# Patient Record
Sex: Female | Born: 1935 | Race: Black or African American | Hispanic: No | Smoking: Never smoker
Health system: Southern US, Community
[De-identification: ages and names within clinical notes are randomized; demographics above are authoritative.]

## PROBLEM LIST (undated history)

## (undated) DIAGNOSIS — Z5189 Encounter for other specified aftercare: Secondary | ICD-10-CM

## (undated) DIAGNOSIS — I214 Non-ST elevation (NSTEMI) myocardial infarction: Secondary | ICD-10-CM

## (undated) DIAGNOSIS — I509 Heart failure, unspecified: Secondary | ICD-10-CM

## (undated) DIAGNOSIS — I10 Essential (primary) hypertension: Secondary | ICD-10-CM

## (undated) DIAGNOSIS — I2510 Atherosclerotic heart disease of native coronary artery without angina pectoris: Secondary | ICD-10-CM

## (undated) DIAGNOSIS — K922 Gastrointestinal hemorrhage, unspecified: Secondary | ICD-10-CM

## (undated) DIAGNOSIS — E119 Type 2 diabetes mellitus without complications: Secondary | ICD-10-CM

## (undated) DIAGNOSIS — I469 Cardiac arrest, cause unspecified: Secondary | ICD-10-CM

## (undated) DIAGNOSIS — G931 Anoxic brain damage, not elsewhere classified: Secondary | ICD-10-CM

---

## 2016-09-12 ENCOUNTER — Inpatient Hospital Stay (HOSPITAL_COMMUNITY): Payer: Medicare Other

## 2016-09-12 ENCOUNTER — Encounter (HOSPITAL_COMMUNITY): Payer: Self-pay | Admitting: Emergency Medicine

## 2016-09-12 ENCOUNTER — Inpatient Hospital Stay (HOSPITAL_COMMUNITY)
Admission: EM | Admit: 2016-09-12 | Discharge: 2016-10-02 | DRG: 870 | Disposition: E | Payer: Medicare Other | Attending: Emergency Medicine | Admitting: Emergency Medicine

## 2016-09-12 ENCOUNTER — Emergency Department (HOSPITAL_COMMUNITY): Payer: Medicare Other

## 2016-09-12 DIAGNOSIS — Z515 Encounter for palliative care: Secondary | ICD-10-CM | POA: Diagnosis not present

## 2016-09-12 DIAGNOSIS — I5033 Acute on chronic diastolic (congestive) heart failure: Secondary | ICD-10-CM | POA: Diagnosis present

## 2016-09-12 DIAGNOSIS — J189 Pneumonia, unspecified organism: Secondary | ICD-10-CM | POA: Diagnosis present

## 2016-09-12 DIAGNOSIS — Y95 Nosocomial condition: Secondary | ICD-10-CM | POA: Diagnosis not present

## 2016-09-12 DIAGNOSIS — Z66 Do not resuscitate: Secondary | ICD-10-CM | POA: Diagnosis not present

## 2016-09-12 DIAGNOSIS — I2510 Atherosclerotic heart disease of native coronary artery without angina pectoris: Secondary | ICD-10-CM | POA: Diagnosis not present

## 2016-09-12 DIAGNOSIS — Z930 Tracheostomy status: Secondary | ICD-10-CM | POA: Diagnosis not present

## 2016-09-12 DIAGNOSIS — J9610 Chronic respiratory failure, unspecified whether with hypoxia or hypercapnia: Secondary | ICD-10-CM | POA: Diagnosis not present

## 2016-09-12 DIAGNOSIS — Z9911 Dependence on respirator [ventilator] status: Secondary | ICD-10-CM

## 2016-09-12 DIAGNOSIS — I959 Hypotension, unspecified: Secondary | ICD-10-CM | POA: Diagnosis not present

## 2016-09-12 DIAGNOSIS — A419 Sepsis, unspecified organism: Secondary | ICD-10-CM | POA: Diagnosis present

## 2016-09-12 DIAGNOSIS — R54 Age-related physical debility: Secondary | ICD-10-CM | POA: Diagnosis not present

## 2016-09-12 DIAGNOSIS — G931 Anoxic brain damage, not elsewhere classified: Secondary | ICD-10-CM | POA: Diagnosis present

## 2016-09-12 DIAGNOSIS — R403 Persistent vegetative state: Secondary | ICD-10-CM | POA: Diagnosis present

## 2016-09-12 DIAGNOSIS — N179 Acute kidney failure, unspecified: Secondary | ICD-10-CM | POA: Diagnosis not present

## 2016-09-12 DIAGNOSIS — E872 Acidosis: Secondary | ICD-10-CM | POA: Diagnosis not present

## 2016-09-12 DIAGNOSIS — R6521 Severe sepsis with septic shock: Secondary | ICD-10-CM | POA: Diagnosis not present

## 2016-09-12 DIAGNOSIS — L8993 Pressure ulcer of unspecified site, stage 3: Secondary | ICD-10-CM | POA: Diagnosis not present

## 2016-09-12 DIAGNOSIS — J9621 Acute and chronic respiratory failure with hypoxia: Secondary | ICD-10-CM | POA: Diagnosis present

## 2016-09-12 DIAGNOSIS — B370 Candidal stomatitis: Secondary | ICD-10-CM | POA: Diagnosis present

## 2016-09-12 DIAGNOSIS — Z888 Allergy status to other drugs, medicaments and biological substances status: Secondary | ICD-10-CM | POA: Diagnosis not present

## 2016-09-12 DIAGNOSIS — J9622 Acute and chronic respiratory failure with hypercapnia: Secondary | ICD-10-CM | POA: Diagnosis not present

## 2016-09-12 DIAGNOSIS — E11649 Type 2 diabetes mellitus with hypoglycemia without coma: Secondary | ICD-10-CM | POA: Diagnosis not present

## 2016-09-12 DIAGNOSIS — B379 Candidiasis, unspecified: Secondary | ICD-10-CM | POA: Diagnosis not present

## 2016-09-12 DIAGNOSIS — I468 Cardiac arrest due to other underlying condition: Secondary | ICD-10-CM | POA: Diagnosis present

## 2016-09-12 DIAGNOSIS — J9690 Respiratory failure, unspecified, unspecified whether with hypoxia or hypercapnia: Secondary | ICD-10-CM

## 2016-09-12 DIAGNOSIS — J9601 Acute respiratory failure with hypoxia: Secondary | ICD-10-CM | POA: Diagnosis not present

## 2016-09-12 DIAGNOSIS — I110 Hypertensive heart disease with heart failure: Secondary | ICD-10-CM | POA: Diagnosis present

## 2016-09-12 DIAGNOSIS — R680 Hypothermia, not associated with low environmental temperature: Secondary | ICD-10-CM | POA: Diagnosis present

## 2016-09-12 DIAGNOSIS — I252 Old myocardial infarction: Secondary | ICD-10-CM

## 2016-09-12 DIAGNOSIS — Z931 Gastrostomy status: Secondary | ICD-10-CM

## 2016-09-12 DIAGNOSIS — I469 Cardiac arrest, cause unspecified: Secondary | ICD-10-CM | POA: Diagnosis present

## 2016-09-12 DIAGNOSIS — D6489 Other specified anemias: Secondary | ICD-10-CM | POA: Diagnosis present

## 2016-09-12 DIAGNOSIS — L8990 Pressure ulcer of unspecified site, unspecified stage: Secondary | ICD-10-CM | POA: Insufficient documentation

## 2016-09-12 HISTORY — DX: Type 2 diabetes mellitus without complications: E11.9

## 2016-09-12 HISTORY — DX: Non-ST elevation (NSTEMI) myocardial infarction: I21.4

## 2016-09-12 HISTORY — DX: Essential (primary) hypertension: I10

## 2016-09-12 HISTORY — DX: Cardiac arrest, cause unspecified: I46.9

## 2016-09-12 HISTORY — DX: Encounter for other specified aftercare: Z51.89

## 2016-09-12 HISTORY — DX: Gastrointestinal hemorrhage, unspecified: K92.2

## 2016-09-12 HISTORY — DX: Atherosclerotic heart disease of native coronary artery without angina pectoris: I25.10

## 2016-09-12 HISTORY — DX: Heart failure, unspecified: I50.9

## 2016-09-12 HISTORY — DX: Anoxic brain damage, not elsewhere classified: G93.1

## 2016-09-12 LAB — COMPREHENSIVE METABOLIC PANEL
ALBUMIN: 2.2 g/dL — AB (ref 3.5–5.0)
ALK PHOS: 145 U/L — AB (ref 38–126)
ALT: 109 U/L — ABNORMAL HIGH (ref 14–54)
ALT: 101 U/L — AB (ref 14–54)
ANION GAP: 12 (ref 5–15)
ANION GAP: 11 (ref 5–15)
AST: 112 U/L — ABNORMAL HIGH (ref 15–41)
AST: 92 U/L — ABNORMAL HIGH (ref 15–41)
Albumin: 2.3 g/dL — ABNORMAL LOW (ref 3.5–5.0)
Alkaline Phosphatase: 142 U/L — ABNORMAL HIGH (ref 38–126)
BILIRUBIN TOTAL: 0.9 mg/dL (ref 0.3–1.2)
BUN: 32 mg/dL — ABNORMAL HIGH (ref 6–20)
BUN: 31 mg/dL — ABNORMAL HIGH (ref 6–20)
CALCIUM: 9.7 mg/dL (ref 8.9–10.3)
CHLORIDE: 96 mmol/L — AB (ref 101–111)
CO2: 29 mmol/L (ref 22–32)
CO2: 29 mmol/L (ref 22–32)
CREATININE: 0.96 mg/dL (ref 0.44–1.00)
Calcium: 9.7 mg/dL (ref 8.9–10.3)
Chloride: 95 mmol/L — ABNORMAL LOW (ref 101–111)
Creatinine, Ser: 1.02 mg/dL — ABNORMAL HIGH (ref 0.44–1.00)
GFR calc Af Amer: >60 mL/min (ref >60)
GFR calc non Af Amer: 55 mL/min — ABNORMAL LOW (ref >60)
GFR, EST AFRICAN AMERICAN: 59 mL/min — AB (ref >60)
GFR, EST NON AFRICAN AMERICAN: 51 mL/min — AB (ref >60)
GLUCOSE: 333 mg/dL — AB (ref 65–99)
Glucose, Bld: 203 mg/dL — ABNORMAL HIGH (ref 65–99)
Potassium: 4.9 mmol/L (ref 3.5–5.1)
Potassium: 4.5 mmol/L (ref 3.5–5.1)
SODIUM: 136 mmol/L (ref 135–145)
Sodium: 136 mmol/L (ref 135–145)
TOTAL PROTEIN: 7.5 g/dL (ref 6.5–8.1)
Total Bilirubin: 1.2 mg/dL (ref 0.3–1.2)
Total Protein: 7.3 g/dL (ref 6.5–8.1)

## 2016-09-12 LAB — URINALYSIS, ROUTINE W REFLEX MICROSCOPIC
Bacteria, UA: NONE SEEN
Bilirubin Urine: NEGATIVE
Bilirubin Urine: NEGATIVE
Glucose, UA: >=500 mg/dL — AB
Hgb urine dipstick: NEGATIVE
Ketones, ur: NEGATIVE mg/dL
Ketones, ur: 5 mg/dL — AB
Leukocytes, UA: NEGATIVE
Nitrite: NEGATIVE
Nitrite: NEGATIVE
PH: 8 (ref 5.0–8.0)
Protein, ur: NEGATIVE mg/dL
Protein, ur: NEGATIVE mg/dL
SPECIFIC GRAVITY, URINE: 1.01 (ref 1.005–1.030)
SPECIFIC GRAVITY, URINE: 1.01 (ref 1.005–1.030)
SQUAMOUS EPITHELIAL / LPF: NONE SEEN
pH: 7 (ref 5.0–8.0)

## 2016-09-12 LAB — I-STAT CG4 LACTIC ACID, ED: Lactic Acid, Venous: 2.63 mmol/L (ref 0.5–1.9)

## 2016-09-12 LAB — CBC
HCT: 31.7 % — ABNORMAL LOW (ref 36.0–46.0)
HEMOGLOBIN: 9.7 g/dL — AB (ref 12.0–15.0)
MCH: 25.6 pg — ABNORMAL LOW (ref 26.0–34.0)
MCHC: 30.6 g/dL (ref 30.0–36.0)
MCV: 83.6 fL (ref 78.0–100.0)
PLATELETS: 412 10*3/uL — AB (ref 150–400)
RBC: 3.79 MIL/uL — AB (ref 3.87–5.11)
RDW: 19.7 % — ABNORMAL HIGH (ref 11.5–15.5)
WBC: 23.5 10*3/uL — AB (ref 4.0–10.5)

## 2016-09-12 LAB — I-STAT ARTERIAL BLOOD GAS, ED
ACID-BASE EXCESS: 9 mmol/L — AB (ref 0.0–2.0)
Acid-Base Excess: 3 mmol/L — ABNORMAL HIGH (ref 0.0–2.0)
BICARBONATE: 28.3 mmol/L — AB (ref 20.0–28.0)
Bicarbonate: 34.6 mmol/L — ABNORMAL HIGH (ref 20.0–28.0)
O2 SAT: 100 %
O2 Saturation: 100 %
PCO2 ART: 44.5 mmHg (ref 32.0–48.0)
PH ART: 7.418 (ref 7.350–7.450)
PO2 ART: 168 mmHg — AB (ref 83.0–108.0)
Patient temperature: 98.6
Patient temperature: 98.5
TCO2: 36 mmol/L (ref 0–100)
TCO2: 30 mmol/L (ref 0–100)
pCO2 arterial: 53.6 mmHg — ABNORMAL HIGH (ref 32.0–48.0)
pH, Arterial: 7.412 (ref 7.350–7.450)
pO2, Arterial: 356 mmHg — ABNORMAL HIGH (ref 83.0–108.0)

## 2016-09-12 LAB — CBC WITH DIFFERENTIAL/PLATELET
BASOS ABS: 0 10*3/uL (ref 0.0–0.1)
Basophils Relative: 0 %
EOS ABS: 0 10*3/uL (ref 0.0–0.7)
Eosinophils Relative: 0 %
HCT: 34 % — ABNORMAL LOW (ref 36.0–46.0)
Hemoglobin: 10.5 g/dL — ABNORMAL LOW (ref 12.0–15.0)
LYMPHS PCT: 4 %
Lymphs Abs: 0.9 10*3/uL (ref 0.7–4.0)
MCH: 26 pg (ref 26.0–34.0)
MCHC: 30.9 g/dL (ref 30.0–36.0)
MCV: 84.2 fL (ref 78.0–100.0)
Monocytes Absolute: 0.5 10*3/uL (ref 0.1–1.0)
Monocytes Relative: 2 %
NEUTROS ABS: 21.4 10*3/uL — AB (ref 1.7–7.7)
Neutrophils Relative %: 94 %
PLATELETS: 391 10*3/uL (ref 150–400)
RBC: 4.04 MIL/uL (ref 3.87–5.11)
RDW: 19.8 % — ABNORMAL HIGH (ref 11.5–15.5)
WBC: 22.8 10*3/uL — AB (ref 4.0–10.5)

## 2016-09-12 LAB — PHOSPHORUS: PHOSPHORUS: 3.5 mg/dL (ref 2.5–4.6)

## 2016-09-12 LAB — GLUCOSE, CAPILLARY
GLUCOSE-CAPILLARY: 87 mg/dL (ref 65–99)
Glucose-Capillary: 305 mg/dL — ABNORMAL HIGH (ref 65–99)
Glucose-Capillary: 201 mg/dL — ABNORMAL HIGH (ref 65–99)
Glucose-Capillary: 80 mg/dL (ref 65–99)

## 2016-09-12 LAB — PROTIME-INR
INR: 1.18
INR: 1.17
PROTHROMBIN TIME: 15.1 s (ref 11.4–15.2)
PROTHROMBIN TIME: 15 s (ref 11.4–15.2)

## 2016-09-12 LAB — MRSA PCR SCREENING: MRSA by PCR: NEGATIVE

## 2016-09-12 LAB — APTT: APTT: 29 s (ref 24–36)

## 2016-09-12 LAB — PROCALCITONIN: PROCALCITONIN: 0.63 ng/mL

## 2016-09-12 LAB — I-STAT TROPONIN, ED: Troponin i, poc: 0.01 ng/mL (ref 0.00–0.08)

## 2016-09-12 LAB — CORTISOL: Cortisol, Plasma: 43.1 ug/dL

## 2016-09-12 LAB — MAGNESIUM
MAGNESIUM: 2.1 mg/dL (ref 1.7–2.4)
Magnesium: 1.8 mg/dL (ref 1.7–2.4)

## 2016-09-12 LAB — STREP PNEUMONIAE URINARY ANTIGEN: Strep Pneumo Urinary Antigen: NEGATIVE

## 2016-09-12 MED ORDER — FAMOTIDINE 40 MG/5ML PO SUSR
20.0000 mg | Freq: Two times a day (BID) | ORAL | Status: DC
  Administered 2016-09-12 – 2016-09-15 (×7): 20 mg
  Filled 2016-09-12 (×8): qty 2.5

## 2016-09-12 MED ORDER — ORAL CARE MOUTH RINSE
15.0000 mL | OROMUCOSAL | Status: DC
  Administered 2016-09-12 – 2016-09-16 (×42): 15 mL via OROMUCOSAL

## 2016-09-12 MED ORDER — SODIUM BICARBONATE 8.4 % IV SOLN
INTRAVENOUS | Status: AC | PRN
  Administered 2016-09-12: 100 meq via INTRAVENOUS

## 2016-09-12 MED ORDER — PANTOPRAZOLE SODIUM 40 MG IV SOLR: 40.0000 mg | Freq: Every day | INTRAVENOUS | Status: DC

## 2016-09-12 MED ORDER — VANCOMYCIN HCL IN DEXTROSE 750-5 MG/150ML-% IV SOLN
750.0000 mg | INTRAVENOUS | Status: DC
  Administered 2016-09-13 – 2016-09-15 (×3): 750 mg via INTRAVENOUS
  Filled 2016-09-12 (×3): qty 150

## 2016-09-12 MED ORDER — CALCIUM CHLORIDE 10 % IV SOLN
INTRAVENOUS | Status: AC | PRN
  Administered 2016-09-12: 1 g via INTRAVENOUS

## 2016-09-12 MED ORDER — INSULIN ASPART 100 UNIT/ML ~~LOC~~ SOLN
2.0000 [IU] | SUBCUTANEOUS | Status: DC
  Administered 2016-09-12 (×2): 6 [IU] via SUBCUTANEOUS
  Administered 2016-09-13 (×3): 4 [IU] via SUBCUTANEOUS
  Administered 2016-09-13: 6 [IU] via SUBCUTANEOUS
  Administered 2016-09-13: 2 [IU] via SUBCUTANEOUS
  Administered 2016-09-14: 6 [IU] via SUBCUTANEOUS
  Administered 2016-09-14: 2 [IU] via SUBCUTANEOUS

## 2016-09-12 MED ORDER — PIPERACILLIN-TAZOBACTAM 3.375 G IVPB: 3.3750 g | Freq: Three times a day (TID) | INTRAVENOUS | Status: DC

## 2016-09-12 MED ORDER — ACETAMINOPHEN 325 MG PO TABS: 650.0000 mg | ORAL_TABLET | ORAL | Status: DC | PRN

## 2016-09-12 MED ORDER — NOREPINEPHRINE BITARTRATE 1 MG/ML IV SOLN
5.0000 ug/min | INTRAVENOUS | Status: DC
  Administered 2016-09-12: 5 ug/min via INTRAVENOUS
  Filled 2016-09-12: qty 4

## 2016-09-12 MED ORDER — INSULIN ASPART 100 UNIT/ML ~~LOC~~ SOLN: 2.0000 [IU] | SUBCUTANEOUS | Status: DC

## 2016-09-12 MED ORDER — SODIUM CHLORIDE 0.9% FLUSH: 10.0000 mL | INTRAVENOUS | Status: DC | PRN

## 2016-09-12 MED ORDER — SODIUM CHLORIDE 0.9 % IV SOLN
INTRAVENOUS | Status: DC
  Administered 2016-09-12: 08:00:00 via INTRAVENOUS

## 2016-09-12 MED ORDER — VANCOMYCIN HCL IN DEXTROSE 1-5 GM/200ML-% IV SOLN
1000.0000 mg | Freq: Once | INTRAVENOUS | Status: AC
  Administered 2016-09-12: 1000 mg via INTRAVENOUS
  Filled 2016-09-12: qty 200

## 2016-09-12 MED ORDER — DEXTROSE 5 % IV SOLN
2.0000 g | Freq: Once | INTRAVENOUS | Status: AC
  Administered 2016-09-12: 2 g via INTRAVENOUS
  Filled 2016-09-12: qty 2

## 2016-09-12 MED ORDER — PIPERACILLIN-TAZOBACTAM 3.375 G IVPB
3.3750 g | Freq: Three times a day (TID) | INTRAVENOUS | Status: DC
  Administered 2016-09-12 – 2016-09-15 (×9): 3.375 g via INTRAVENOUS
  Filled 2016-09-12 (×11): qty 50

## 2016-09-12 MED ORDER — SODIUM CHLORIDE 0.9 % IV BOLUS (SEPSIS)
1000.0000 mL | Freq: Once | INTRAVENOUS | Status: AC
  Administered 2016-09-12: 1000 mL via INTRAVENOUS

## 2016-09-12 MED ORDER — CHLORHEXIDINE GLUCONATE 0.12% ORAL RINSE (MEDLINE KIT): 15.0000 mL | Freq: Two times a day (BID) | OROMUCOSAL | Status: DC

## 2016-09-12 MED ORDER — SODIUM CHLORIDE 0.9% FLUSH
10.0000 mL | Freq: Two times a day (BID) | INTRAVENOUS | Status: DC
  Administered 2016-09-12 – 2016-09-15 (×8): 10 mL

## 2016-09-12 MED ORDER — EPINEPHRINE PF 1 MG/10ML IJ SOSY
PREFILLED_SYRINGE | INTRAMUSCULAR | Status: AC | PRN
  Administered 2016-09-12: 1 mg via INTRAVENOUS

## 2016-09-12 MED FILL — Medication: Qty: 1 | Status: AC

## 2016-09-12 NOTE — ED Triage Notes (Signed)
Pt [facility] by [facility]. Pt was given a bath by [facility] staff and had a PEA arrest around 0340 4 mins of CPR given on [facility] one run of epic given. Initial HR Post CPR 150-170, per [facility] staff pt was no verbal but able to follow commands and follow with her eye, complete unresponsive after cpr. Pt has a trach in place, peg tube and foley catheter from [facility]. Dopamine 10 mcg/kg started on [facility].

## 2016-09-12 NOTE — H&P (Signed)
PULMONARY / CRITICAL CARE MEDICINE   Name: Ana Brown. Ana Brown MRN: [id number] DOB: April 24, 1935    ADMISSION DATE:  09/27/16   REFERRING MD:  EDP  CHIEF COMPLAINT: Post arrest  HISTORY OF PRESENT ILLNESS:   82 yo female who was treated at [facility]) for cardiac arrest 12/17 and required trach/vent/peg due to persistent vegetative state. 1/12 WHILE BEING BATHED SHE SUFFERED ANOTHER PEA ARREST and required epi , shock x2 along with CPR ?time. She was transported to [facility] ED and was started on levo for low BP. Not a candidate for hypothermia therefore will transfer to CCU forI CU care.  PAST MEDICAL HISTORY :  She  has a past medical history of Anoxic brain injury (HCC); Blood transfusion without reported diagnosis; Cardiac arrest [facility]); CHF (congestive heart failure) (HCC); Coronary artery disease; Diabetes mellitus without complication (HCC); GI bleed; Hypertension; and NSTEMI (non-ST elevated myocardial infarction) (HCC).  PAST SURGICAL HISTORY: She  has no past surgical history on file.  Allergies  Allergen Reactions  . Hibiclens [Chlorhexidine Gluconate] Other (See Comments)    On MAR    No current facility-administered medications on file prior to encounter.    No current outpatient prescriptions on file prior to encounter.    FAMILY HISTORY:  Her has no family status information on file.    SOCIAL HISTORY: She  reports that she has never smoked. She does not have any smokeless tobacco history on file. She reports that she does not drink alcohol or use drugs.  REVIEW OF SYSTEMS:   NA  SUBJECTIVE:  Transfer to ICU post arrest  VITAL SIGNS: BP (!) 147/71   Pulse 77   Temp 98.3 F (36.8 C) (Oral)   Resp 18   Ht 5' (1.524 m)   Wt 156 lb 15.5 oz (71.2 kg)   SpO2 100%   BMI 30.66 kg/m   HEMODYNAMICS:    VENTILATOR SETTINGS: Vent Mode: PRVC FiO2 (%):  [50 %-100 %] 50 % Set Rate:  [20 bmp] 20 bmp Vt Set:  [480 mL] 480 mL PEEP:  [5 cmH20-8 cmH20] 5  cmH20 Plateau Pressure:  [25 cmH20-28 cmH20] 28 cmH20  INTAKE / OUTPUT: I/O last 3 completed shifts: In: 47 [IV Piggyback:50] Out: -   PHYSICAL EXAMINATION: General: Frail wasted female Neuro:  +gag + startle  HEENT: #6 brovana trach Cardiovascular:  HSR RRR Lungs:  Decreased bs bases Abdomen:  Peg. + bs Musculoskeletal:  Rt foot post toes 4/5 removed Skin:  Warm and dry  LABS:  BMET  Recent Labs Lab September 27, 2016 0137 2016/09/27 0708  NA 136 136  K 4.9 4.5  CL 95* 96*  CO2 29 29  BUN 32* 31*  CREATININE 1.02* 0.96  GLUCOSE 203* 333*    Electrolytes  Recent Labs Lab 09/27/2016 0137 Sep 27, 2016 0708  CALCIUM 9.7 9.7  MG 2.1 1.8  PHOS  --  3.5    CBC  Recent Labs Lab 27-Sep-2016 0137 09/27/16 0708  WBC 22.8* 23.5*  HGB 10.5* 9.7*  HCT 34.0* 31.7*  PLT 391 412*    Coag's  Recent Labs Lab Sep 27, 2016 0137 2016/09/27 0708  APTT  --  29  INR 1.18 1.17    Sepsis Markers  Recent Labs Lab 09/27/2016 0539  LATICACIDVEN 2.63*    ABG  Recent Labs Lab September 27, 2016 0600 September 27, 2016 0736  PHART 7.418 7.412  PCO2ART 53.6* 44.5  PO2ART 356.0* 168.0*    Liver Enzymes  Recent Labs Lab 09/27/2016 0137 09/27/16 0708  AST 112*  92*  ALT 109* 101*  ALKPHOS 145* 142*  BILITOT 0.9 1.2  ALBUMIN 2.3* 2.2*    Cardiac Enzymes No results for input(s): TROPONINI, PROBNP in the last 168 hours.  Glucose No results for input(s): GLUCAP in the last 168 hours.  Imaging Ct Head Wo Contrast  Result Date: 09/22/2016 CLINICAL DATA:  Cardiac arrest today. Anoxic brain injury from cardiac arrest 07/2016. EXAM: CT HEAD WITHOUT CONTRAST TECHNIQUE: Contiguous axial images were obtained from the base of the skull through the vertex without intravenous contrast. COMPARISON:  None. FINDINGS: Brain: There is no evidence of acute cortical infarct, intracranial hemorrhage, mass, midline shift, or extra-axial fluid collection. Ventricular enlargement is favored to reflect central predominant  cerebral atrophy of a moderate degree. With patchy to confluent cerebral white matter hypodensities are nonspecific but compatible with moderately extensive chronic small vessel ischemic disease. Vascular: Calcified atherosclerosis at the skullbase. No hyperdense vessel. Skull: No fracture or focal osseous lesion. Sinuses/Orbits: Prior bilateral cataract extraction. Small volume fluid in the maxillary sinuses. Moderate bilateral ethmoid sinus mucosal thickening. Trace mastoid effusions. Other: None. IMPRESSION: 1. No evidence of acute intracranial abnormality. 2. Moderate chronic small vessel ischemic disease and cerebral atrophy Electronically Signed   By: Sebastian Ache M.D.   On: September 22, 2016 08:36   Dg Chest Port 1 View  Result Date: 09/22/2016 CLINICAL DATA:  Post cardiac arrest EXAM: PORTABLE CHEST 1 VIEW COMPARISON:  None. FINDINGS: Ventilatory device overlies the trachea at the level of the clavicles. There is mild cardiomegaly with aortic atherosclerosis. Bilateral mild parahilar opacities without focal consolidation or overt pulmonary edema. Left subclavian approach central venous catheter tip is at the junction of the brachiocephalic vein and superior vena cava. IMPRESSION: 1. Aortic atherosclerosis and mild cardiomegaly without overt pulmonary edema or focal consolidation. 2. Left subclavian approach central venous catheter tip at the junction of the brachiocephalic vein and superior vena cava. Electronically Signed   By: Deatra Robinson M.D.   On: Sep 22, 2016 05:54     STUDIES:  Sep 22, 2022 ct head neg  CULTURES: Sep 22, 2022 bcx2>> 22-Sep-2022 uc>> 09/22/2022 sputum>>  ANTIBIOTICS: Sep 22, 2022 vanc>> 09/22/2022 zoysn>>  SIGNIFICANT EVENTS: September 22, 2022 coded  LINES/TUBES: Left Harrold cvl>>  DISCUSSION: 82 yo female who was treated at [facility]) for cardiac arrest 12/17 and required trach/vent/peg due to persistent vegetative state. 09-22-2022 WHILE BEING BATHED SHE SUFFERED ANOTHER PEA ARREST and required epi , shock x2 along  with CPR ?time. She was transported to [facility] ED and was started on levo for low BP. Not a candidate for hypothermia therefore will transfer to CCU forI CU care. Lactic acid is elevated but she was coded .  ASSESSMENT / PLAN:  PULMONARY A: Chronic Trach/vent dependent since 12/17post arrest in [place] and [facility] to [facility] [place] Suspected resp arrest leading to cardiac event 09/22/22 Brovana armored trach in place P:   Vent bundle Cxr BD as needed   CARDIOVASCULAR A:  Post cardiac rest with epi/cpr x 2 rounds. Witnessed during bathing, may be resp arrest leading to cardiac event P:  Pressors as needed No Hypothermia Monitor cardiac status in ICU  RENAL Lab Results  Component Value Date   CREATININE 0.96 Sep 22, 2016   CREATININE 1.02 (H) Sep 22, 2016    A:   NO acute issue P:   Follow labs  GASTROINTESTINAL A:   Peg in place P:   Resume TF PPI  HEMATOLOGIC  Recent Labs  09/22/16 0137 22-Sep-2016 0708  HGB 10.5* 9.7*    A:  Chronic anemia P:  Follow H/H transfuse for hgb<7  INFECTIOUS A:   Pt at SNF P:   Treat with with Vanc/Zoysn and pan cuture  ENDOCRINE CBG (last 3)  No results for input(s): GLUCAP in the last 72 hours.   A:   DM  P:   SSI  NEUROLOGIC A:   Persistent vegetative state, trach/vent/peg dependent from previous neurological injury from cardiac arrest 12/17 [place] P:   RASS goal: 0 Opens eyes no tracks or follows Check head ct for completeness   FAMILY  - Updates: No family avaiable  - Inter-disciplinary family meet or Palliative Care meeting due by:  day 7  CCT 60 min  Brett CanalesSteve Minor ACNP [facility] PCCM Pager [phone] till 3 pm If no answer page [phone] 05-14-2017, 10:09 AM  Attending Note:  I have examined patient, reviewed labs, studies and notes. I have discussed the case with S Minor, and I agree with the data and plans as amended above. Unfortunate 82 yo woman, chronically debilitated,  vent and PEG dependent due to anoxic injury following cardiac arrest Dec '17. Per report is non-verbal but will follow some commands at her baseline. She suffered an apparent respiratory arrest and then cardiac arrest at [facility] on 1/12. She underwent CPR + epi + defib x 2, duration unclear. She was started on pressor and [facility]. On eval her BP is improved and pressors are being weaned to off. She will open her eyes to stim but does not follow any commands. She is breathing over the set vent rate. Lung exam has fine insp crackles, no wheeze. We will attempt to wean pressors to off, continue empiric broad abx pending resp cx data. CT head was checked prior to ICU transfer > nothing acute. Suspect that she is stabilizing although do not know what her new baseline will be. I will not initiate hypothermia given her prior baseline neuro status. Clearly discussions with family regarding goals for care would be beneficial here.  Independent critical care time is 40 minutes.   Levy Pupaobert Byrum, MD, PhD 05-14-2017, 3:44 PM [place] [facility] [phone] or if no answer [phone]

## 2016-09-12 NOTE — Code Documentation (Addendum)
Pulse check, shock given 150J.

## 2016-09-12 NOTE — ED Notes (Signed)
Dopamine paused by Dr. Mora Bellmanni.

## 2016-09-12 NOTE — Progress Notes (Signed)
Pharmacy Antibiotic Note  Ana Brown is a 82 y.o. female admitted on 09/05/2016 with pneumonia.  Pharmacy has been consulted for vancomycin and zosyn dosing.  Patient received cefepime 2g and vancomycin 1g IV once in the ED.  Plan: Vancomycin 1g IV every 24 hours.  Goal trough 15-20 mcg/mL. Zosyn 3.375g IV q8h (4 hour infusion).  Monitor culture data, renal function and clinical VT [facility]  Height: 5' (152.4 cm) Weight: 156 lb 15.5 oz (71.2 kg) IBW/kg (Calculated) : 45.5  Temp (24hrs), Avg:98.1 F (36.7 C), Min:98.1 F (36.7 C), Max:98.1 F (36.7 C)   Recent Labs Lab 09/23/2016 0137 09/15/2016 0539  WBC 22.8*  --   CREATININE 1.02*  --   LATICACIDVEN  --  2.63*    Estimated Creatinine Clearance: 39.4 mL/min (by C-G formula based on SCr of 1.02 mg/dL (H)).    Allergies  Allergen Reactions  . Hibiclens [Chlorhexidine Gluconate] Other (See Comments)    On MAR     Derwin Reddy M. Newman PiesBall, PharmD, BCPS Clinical Pharmacist Pager [phone] 09/21/2016 7:52 AM

## 2016-09-12 NOTE — ED Provider Notes (Signed)
[facility]-EMERGENCY DEPT Provider Note   CSN: [id number] Arrival date & time: 11/15/16  0515     History   Chief Complaint Chief Complaint  Patient presents with  . Cardiac Arrest    post CPR    HPI Ana Brown is a 82 y.o. female PMH of CAD, CHF, DM, HTN, here after cardiac arrest at her facility.  They were giving her a bath when she lost pulses (EMS states possible trach was kinked).  They did CPR with 2 rounds of epi and got ROSC.  She was placed on dopamine gtt and transferred here.  She is nonverbal and can not give any history.    HPI  Past Medical History:  Diagnosis Date  . Anoxic brain injury (HCC)   . Blood transfusion without reported diagnosis   . Cardiac arrest (HCC)   . CHF (congestive heart failure) (HCC)   . Coronary artery disease   . Diabetes mellitus without complication (HCC)   . GI bleed   . Hypertension   . NSTEMI (non-ST elevated myocardial infarction) (HCC)     There are no active problems to display for this patient.   History reviewed. No pertinent surgical history.  OB History    No data available       Home Medications    Prior to Admission medications   Not on File    Family History History reviewed. No pertinent family history.  Social History Social History  Substance Use Topics  . Smoking status: Never Smoker  . Smokeless tobacco: Not on file  . Alcohol use No     Allergies   Hibiclens [chlorhexidine gluconate]   Review of Systems Review of Systems  Unable to perform ROS: Patient nonverbal     Physical Exam Updated Vital Signs BP (!) 138/107 (BP Location: Right Arm)   Pulse (!) 159   Temp 98.1 F (36.7 C) (Oral)   Resp 20   SpO2 (!) 89%   Physical Exam  Constitutional: She appears well-developed and well-nourished. She appears distressed.  HENT:  Head: Atraumatic.  Nose: Nose normal.  Trach in place, no signs of infection or swelling.  Eyes: Conjunctivae are normal. No scleral icterus.  Neck: No  JVD present. No tracheal deviation present. No thyromegaly present.  Cardiovascular: Regular rhythm and normal heart sounds.  Exam reveals no gallop and no friction rub.   No murmur heard. tachycardic  Pulmonary/Chest: Effort normal and breath sounds normal. No respiratory distress. She has no wheezes. She exhibits no tenderness.  Abdominal: Soft. Bowel sounds are normal. She exhibits no distension and no mass. There is no tenderness. There is no rebound and no guarding.  Genitourinary:  Genitourinary Comments: Foley in place  Musculoskeletal: She exhibits edema.  Lymphadenopathy:    She has no cervical adenopathy.  Neurological:  Patient does not speak, she does move all extremities in response to painful stimuli.    Skin: Skin is warm and dry. Capillary refill takes less than 2 seconds. No rash noted. No erythema. No pallor.  Nursing note and vitals reviewed.    ED Treatments / Results  Labs (all labs ordered are listed, but only abnormal results are displayed) Labs Reviewed  CBC WITH DIFFERENTIAL/PLATELET - Abnormal; Notable for the following:       Result Value   WBC 22.8 (*)    Hemoglobin 10.5 (*)    HCT 34.0 (*)    RDW 19.8 (*)    All other components within normal limits  I-STAT  CG4 LACTIC ACID, ED - Abnormal; Notable for the following:    Lactic Acid, Venous 2.63 (*)    All other components within normal limits  I-STAT ARTERIAL BLOOD GAS, ED - Abnormal; Notable for the following:    pCO2 arterial 53.6 (*)    pO2, Arterial 356.0 (*)    Bicarbonate 34.6 (*)    Acid-Base Excess 9.0 (*)    All other components within normal limits  CULTURE, BLOOD (ROUTINE X 2)  CULTURE, BLOOD (ROUTINE X 2)  URINE CULTURE  COMPREHENSIVE METABOLIC PANEL  URINALYSIS, ROUTINE W REFLEX MICROSCOPIC  PROTIME-INR  MAGNESIUM  I-STAT TROPOININ, ED    EKG  EKG Interpretation  Date/Time:  Friday September 12 2016 05:40:40 EST Ventricular Rate:  122 PR Interval:    QRS Duration: 76 QT  Interval:  305 QTC Calculation: 435 R Axis:   17 Text Interpretation:  Sinus or ectopic atrial tachycardia frequent PVCs Probable anteroseptal infarct, old SVT has now resolved Confirmed by Erroll Luna [phone]) on 09/17/2016 6:18:05 AM       Radiology Dg Chest Port 1 View  Result Date: 09/24/2016 CLINICAL DATA:  Post cardiac arrest EXAM: PORTABLE CHEST 1 VIEW COMPARISON:  None. FINDINGS: Ventilatory device overlies the trachea at the level of the clavicles. There is mild cardiomegaly with aortic atherosclerosis. Bilateral mild parahilar opacities without focal consolidation or overt pulmonary edema. Left subclavian approach central venous catheter tip is at the junction of the brachiocephalic vein and superior vena cava. IMPRESSION: 1. Aortic atherosclerosis and mild cardiomegaly without overt pulmonary edema or focal consolidation. 2. Left subclavian approach central venous catheter tip at the junction of the brachiocephalic vein and superior vena cava. Electronically Signed   By: Deatra Robinson M.D.   On: 09/07/2016 05:54    Procedures Procedures (including critical care time)  Medications Ordered in ED Medications  vancomycin (VANCOCIN) IVPB 1000 mg/200 mL premix (not administered)  ceFEPIme (MAXIPIME) 2 g in dextrose 5 % 50 mL IVPB (not administered)  sodium chloride 0.9 % bolus 1,000 mL (1,000 mLs Intravenous New Bag/Given 09/01/2016 0551)  EPINEPHrine (ADRENALIN) 1 MG/10ML injection (1 mg Intravenous Given 09/09/2016 0533)  sodium bicarbonate injection (100 mEq Intravenous Given 09/30/2016 0541)  calcium chloride injection (1 g Intravenous Given 09/14/2016 0541)     Initial Impression / Assessment and Plan / ED Course  I have reviewed the triage vital signs and the nursing notes.  Pertinent labs & imaging results that were available during my care of the patient were reviewed by me and considered in my medical decision making (see chart for details).  Clinical Course    Patient  presents to the ED after cardiac arrest.  I stopped her dopamine as the patient appeared stable, but she went into cardiac arrest again.  We performed CPR with 2 rounds of epi and 2 shocks.  She received calcium and bicarb and we obtained ROSC.  She was placed on norepi gtt.  She was given broad spectrum abx and infectious work up is pending.     Lactate 2.6, LFTs are elevated, WBc is 22.  May be nonspecific in setting of code.  I spoke with CC and they will admit.  Advised for no cooling as the patient did not have any significant brain function prior to this.  Patient will be admitted for further care.   CRITICAL CARE Performed by: Tomasita Crumble   Total critical care time: 45 minutes  Critical care time was exclusive of separately billable procedures and treating  other patients.  Critical care was necessary to treat or prevent imminent or life-threatening deterioration.  Critical care was time spent personally by me on the following activities: development of treatment plan with patient and/or surrogate as well as nursing, discussions with consultants, evaluation of patient's response to treatment, examination of patient, obtaining history from patient or surrogate, ordering and performing treatments and interventions, ordering and review of laboratory studies, ordering and review of radiographic studies, pulse oximetry and re-evaluation of patient's condition.      Cardiopulmonary Resuscitation (CPR) Procedure Note Directed/Performed by: Tomasita Crumble I personally directed ancillary staff and/or performed CPR in an effort to regain return of spontaneous circulation and to maintain cardiac, neuro and systemic perfusion.      Final Clinical Impressions(s) / ED Diagnoses   Final diagnoses:  None    New Prescriptions New Prescriptions   No medications on file     Tomasita Crumble, MD 09/06/2016 [phone]

## 2016-09-12 NOTE — Progress Notes (Signed)
Paged Dr. Delton CoombesByrum, patient is a diabetic and has not been covered all day, cbg 305, orders to give 6 unit per slidiing scale and recheck at 1600. Will continue to monitor closely.

## 2016-09-12 NOTE — Code Documentation (Signed)
Continue CPR

## 2016-09-12 NOTE — Code Documentation (Signed)
Pulse check, pulse present 179.

## 2016-09-12 NOTE — Code Documentation (Signed)
Levo started at 6mcg

## 2016-09-12 NOTE — Code Documentation (Signed)
Shock given 200J 

## 2016-09-12 NOTE — Code Documentation (Signed)
VT rhythm after shock.

## 2016-09-12 NOTE — Progress Notes (Signed)
RT note:  Received pt from [facility].  Placed on PRVC 480/20/+5/.60 per [facility] settings.  Able to pass suction catheter down tracheostomy without difficulty.  ETCO2 45-50.  BBS present.

## 2016-09-12 NOTE — Code Documentation (Signed)
Pulse check.   

## 2016-09-12 NOTE — Progress Notes (Signed)
Initial Nutrition Assessment  DOCUMENTATION CODES:   Obesity unspecified  INTERVENTION:   Recommend:  Vital High Protein @ 25 ml/hr (600 ml/day) 30 ml Prostat TID Provides: 900 kcal, 97 grams protein, and 501 ml H2O.   NUTRITION DIAGNOSIS:   Inadequate oral intake related to inability to eat as evidenced by NPO status.  GOAL:   Provide needs based on ASPEN/SCCM guidelines  MONITOR:   I & O's, Skin, Vent status  REASON FOR ASSESSMENT:   Consult Assessment of nutrition requirement/status (TF recommendations)  ASSESSMENT:   Pt with PMH of CAD, CHF, DM, HTN, recent cardiac arrest in [place] which resulted in vegetative state with trach/PEG now admitted after cardiac arrest at her facility  Pt on vent support via trach. Not a candidate for hypothermia.  Nutrition-Focused physical exam completed. Findings are no fat depletion, no muscle depletion, and no edema.   Diet Order:  Diet NPO time specified  Skin:  Wound (see comment) (stage III sacrum)  Last BM:  unknown  Height:   Ht Readings from Last 1 Encounters:  09/30/2016 5' (1.524 m)    Weight:   Wt Readings from Last 1 Encounters:  09/28/2016 156 lb 15.5 oz (71.2 kg)    Ideal Body Weight:  45.4 kg  BMI:  Body mass index is 30.66 kg/m.  Estimated Nutritional Needs:   Kcal:  783-996  Protein:  >/= 90 grams  Fluid:  > 1.5 L/day  EDUCATION NEEDS:   No education needs identified at this time  Kendell BaneHeather Cason Luffman RD, LDN, CNSC [phone] Pager [phone] After Hours Pager

## 2016-09-13 ENCOUNTER — Inpatient Hospital Stay (HOSPITAL_COMMUNITY): Payer: Medicare Other

## 2016-09-13 DIAGNOSIS — J189 Pneumonia, unspecified organism: Secondary | ICD-10-CM

## 2016-09-13 DIAGNOSIS — R403 Persistent vegetative state: Secondary | ICD-10-CM

## 2016-09-13 DIAGNOSIS — L8990 Pressure ulcer of unspecified site, unspecified stage: Secondary | ICD-10-CM | POA: Insufficient documentation

## 2016-09-13 DIAGNOSIS — J9621 Acute and chronic respiratory failure with hypoxia: Secondary | ICD-10-CM

## 2016-09-13 DIAGNOSIS — J9622 Acute and chronic respiratory failure with hypercapnia: Secondary | ICD-10-CM

## 2016-09-13 DIAGNOSIS — A419 Sepsis, unspecified organism: Principal | ICD-10-CM

## 2016-09-13 LAB — BASIC METABOLIC PANEL
ANION GAP: 9 (ref 5–15)
BUN: 27 mg/dL — ABNORMAL HIGH (ref 6–20)
CO2: 28 mmol/L (ref 22–32)
Calcium: 9.1 mg/dL (ref 8.9–10.3)
Chloride: 102 mmol/L (ref 101–111)
Creatinine, Ser: 0.82 mg/dL (ref 0.44–1.00)
GLUCOSE: 85 mg/dL (ref 65–99)
POTASSIUM: 3.7 mmol/L (ref 3.5–5.1)
Sodium: 139 mmol/L (ref 135–145)

## 2016-09-13 LAB — URINE CULTURE

## 2016-09-13 LAB — CBC
HCT: 26.7 % — ABNORMAL LOW (ref 36.0–46.0)
Hemoglobin: 8.4 g/dL — ABNORMAL LOW (ref 12.0–15.0)
MCH: 25.8 pg — ABNORMAL LOW (ref 26.0–34.0)
MCHC: 31.5 g/dL (ref 30.0–36.0)
MCV: 81.9 fL (ref 78.0–100.0)
PLATELETS: 350 10*3/uL (ref 150–400)
RBC: 3.26 MIL/uL — AB (ref 3.87–5.11)
RDW: 19.4 % — ABNORMAL HIGH (ref 11.5–15.5)
WBC: 12.9 10*3/uL — AB (ref 4.0–10.5)

## 2016-09-13 LAB — PHOSPHORUS
PHOSPHORUS: 3.4 mg/dL (ref 2.5–4.6)
Phosphorus: 4.7 mg/dL — ABNORMAL HIGH (ref 2.5–4.6)

## 2016-09-13 LAB — GLUCOSE, CAPILLARY
GLUCOSE-CAPILLARY: 259 mg/dL — AB (ref 65–99)
GLUCOSE-CAPILLARY: 187 mg/dL — AB (ref 65–99)
GLUCOSE-CAPILLARY: 171 mg/dL — AB (ref 65–99)
GLUCOSE-CAPILLARY: 137 mg/dL — AB (ref 65–99)
Glucose-Capillary: 130 mg/dL — ABNORMAL HIGH (ref 65–99)
Glucose-Capillary: 146 mg/dL — ABNORMAL HIGH (ref 65–99)
Glucose-Capillary: 168 mg/dL — ABNORMAL HIGH (ref 65–99)
Glucose-Capillary: 279 mg/dL — ABNORMAL HIGH (ref 65–99)
Glucose-Capillary: 47 mg/dL — ABNORMAL LOW (ref 65–99)
Glucose-Capillary: 48 mg/dL — ABNORMAL LOW (ref 65–99)

## 2016-09-13 LAB — MAGNESIUM
MAGNESIUM: 1.9 mg/dL (ref 1.7–2.4)
Magnesium: 1.8 mg/dL (ref 1.7–2.4)

## 2016-09-13 LAB — LACTIC ACID, PLASMA: Lactic Acid, Venous: 0.6 mmol/L (ref 0.5–1.9)

## 2016-09-13 MED ORDER — VITAL HIGH PROTEIN PO LIQD
1000.0000 mL | ORAL | Status: DC
  Administered 2016-09-13 – 2016-09-15 (×3): 1000 mL
  Filled 2016-09-13 (×4): qty 1000

## 2016-09-13 MED ORDER — ACETAMINOPHEN 325 MG PO TABS: 650.0000 mg | ORAL_TABLET | ORAL | Status: DC | PRN

## 2016-09-13 MED ORDER — SODIUM CHLORIDE 0.9 % IV BOLUS (SEPSIS)
1000.0000 mL | Freq: Once | INTRAVENOUS | Status: AC
  Administered 2016-09-13: 1000 mL via INTRAVENOUS

## 2016-09-13 MED ORDER — PRO-STAT SUGAR FREE PO LIQD
30.0000 mL | Freq: Two times a day (BID) | ORAL | Status: DC
  Administered 2016-09-13: 30 mL
  Filled 2016-09-13: qty 30

## 2016-09-13 MED ORDER — PRO-STAT SUGAR FREE PO LIQD
30.0000 mL | Freq: Three times a day (TID) | ORAL | Status: DC
  Administered 2016-09-13 – 2016-09-16 (×9): 30 mL
  Filled 2016-09-13 (×9): qty 30

## 2016-09-13 MED ORDER — FENTANYL CITRATE (PF) 100 MCG/2ML IJ SOLN
50.0000 ug | INTRAMUSCULAR | Status: DC | PRN
  Administered 2016-09-13 (×2): 50 ug via INTRAVENOUS
  Filled 2016-09-13 (×4): qty 2

## 2016-09-13 MED ORDER — MIDAZOLAM HCL 2 MG/2ML IJ SOLN: 1.0000 mg | INTRAMUSCULAR | Status: DC | PRN

## 2016-09-13 MED ORDER — ATORVASTATIN CALCIUM 80 MG PO TABS
80.0000 mg | ORAL_TABLET | Freq: Every day | ORAL | Status: DC
  Administered 2016-09-13 – 2016-09-16 (×4): 80 mg
  Filled 2016-09-13 (×4): qty 1

## 2016-09-13 MED ORDER — DEXTROSE-NACL 5-0.45 % IV SOLN
INTRAVENOUS | Status: DC
  Administered 2016-09-13: 04:00:00 via INTRAVENOUS

## 2016-09-13 MED ORDER — DEXTROSE 50 % IV SOLN
25.0000 mL | Freq: Once | INTRAVENOUS | Status: AC
  Administered 2016-09-13: 25 mL via INTRAVENOUS
  Filled 2016-09-13: qty 50

## 2016-09-13 MED ORDER — DEXTROSE 50 % IV SOLN
25.0000 mL | Freq: Once | INTRAVENOUS | Status: AC
  Administered 2016-09-13: 25 mL via INTRAVENOUS

## 2016-09-13 MED ORDER — VITAL HIGH PROTEIN PO LIQD
1000.0000 mL | ORAL | Status: DC
  Administered 2016-09-13: 1000 mL
  Filled 2016-09-13 (×2): qty 1000

## 2016-09-13 NOTE — Progress Notes (Signed)
I meet with pt's two daughters.  Pt had initial event in November 2017.  She has not had any significant improvement in neuro status.  I had detailed discussion about concerns for long term prognosis.  They are agreeable to having further discussion with palliative care.  Coralyn HellingVineet Kamaile Zachow, MD [facility] 09/13/2016, 9:43 AM Pager:  [phone] After 3pm call: [phone]

## 2016-09-13 NOTE — Progress Notes (Signed)
PCCM Progress Note  Subjective:  Had trouble with Peak pressures and not matching Vt.  Vitals: BP 99/66   Pulse 64   Temp 97.4 F (36.3 C) (Oral)   Resp (!) 27   Ht 5' (1.524 m)   Wt 166 lb 3.6 oz (75.4 kg)   SpO2 100%   BMI 32.46 kg/m   Intake/outpt: I/O last 3 completed shifts: In: 3849.5 [I.V.:1499.5; IV Piggyback:2350] Out: 2130 [Urine:2130]  General: on vent Neuro: doesn't follow commands, grimaces with painful stimulation Eyes: pupils reactive ENT: trach site with clear secretions Cardiac: regular, no murmur Chest: decreased BS on Lt Abd: soft, non tender Ext: 1+ edema Skin: pressure ulcers present prior to this admission   CMP Latest Ref Rng & Units 09/13/2016 December 09, 2016 December 09, 2016  Glucose 65 - 99 mg/dL 85 161(W333(H) 960(A203(H)  BUN 6 - 20 mg/dL 54(U27(H) 98(J31(H) 19(J32(H)  Creatinine 0.44 - 1.00 mg/dL 4.780.82 2.950.96 6.21(H1.02(H)  Sodium 135 - 145 mmol/L 139 136 136  Potassium 3.5 - 5.1 mmol/L 3.7 4.5 4.9  Chloride 101 - 111 mmol/L 102 96(L) 95(L)  CO2 22 - 32 mmol/L 28 29 29   Calcium 8.9 - 10.3 mg/dL 9.1 9.7 9.7  Total Protein 6.5 - 8.1 g/dL - 7.3 7.5  Total Bilirubin 0.3 - 1.2 mg/dL - 1.2 0.9  Alkaline Phos 38 - 126 U/L - 142(H) 145(H)  AST 15 - 41 U/L - 92(H) 112(H)  ALT 14 - 54 U/L - 101(H) 109(H)    CBC Latest Ref Rng & Units 09/13/2016 December 09, 2016 December 09, 2016  WBC 4.0 - 10.5 K/uL 12.9(H) 23.5(H) 22.8(H)  Hemoglobin 12.0 - 15.0 g/dL 0.8(M8.4(L) 5.7(Q9.7(L) 10.5(L)  Hematocrit 36.0 - 46.0 % 26.7(L) 31.7(L) 34.0(L)  Platelets 150 - 400 K/uL 350 412(H) 391    ABG    Component Value Date/Time   PHART 7.358 09/13/2016 0420   PCO2ART 52.1 (H) 09/13/2016 0420   PO2ART 149 (H) 09/13/2016 0420   HCO3 28.6 (H) 09/13/2016 0420   TCO2 30 0April 10, 2018 0736   O2SAT 98.9 09/13/2016 0420    CBG (last 3)   Recent Labs  09/13/16 0050 09/13/16 0510 09/13/16 0517  GLUCAP 130* 279* 146*    Imaging: Ct Head Wo Contrast  Result Date: December 09, 2016 CLINICAL DATA:  Cardiac arrest today. Anoxic brain  injury from cardiac arrest 07/2016. EXAM: CT HEAD WITHOUT CONTRAST TECHNIQUE: Contiguous axial images were obtained from the base of the skull through the vertex without intravenous contrast. COMPARISON:  None. FINDINGS: Brain: There is no evidence of acute cortical infarct, intracranial hemorrhage, mass, midline shift, or extra-axial fluid collection. Ventricular enlargement is favored to reflect central predominant cerebral atrophy of a moderate degree. With patchy to confluent cerebral white matter hypodensities are nonspecific but compatible with moderately extensive chronic small vessel ischemic disease. Vascular: Calcified atherosclerosis at the skullbase. No hyperdense vessel. Skull: No fracture or focal osseous lesion. Sinuses/Orbits: Prior bilateral cataract extraction. Small volume fluid in the maxillary sinuses. Moderate bilateral ethmoid sinus mucosal thickening. Trace mastoid effusions. Other: None. IMPRESSION: 1. No evidence of acute intracranial abnormality. 2. Moderate chronic small vessel ischemic disease and cerebral atrophy Electronically Signed   By: Sebastian AcheAllen  Grady M.D.   On: 0April 10, 2018 08:36   Dg Chest Port 1 View  Result Date: December 09, 2016 CLINICAL DATA:  Post cardiac arrest EXAM: PORTABLE CHEST 1 VIEW COMPARISON:  None. FINDINGS: Ventilatory device overlies the trachea at the level of the clavicles. There is mild cardiomegaly with aortic atherosclerosis. Bilateral mild parahilar opacities without focal consolidation or overt  pulmonary edema. Left subclavian approach central venous catheter tip is at the junction of the brachiocephalic vein and superior vena cava. IMPRESSION: 1. Aortic atherosclerosis and mild cardiomegaly without overt pulmonary edema or focal consolidation. 2. Left subclavian approach central venous catheter tip at the junction of the brachiocephalic vein and superior vena cava. Electronically Signed   By: Deatra Robinson M.D.   On: 09/28/2016 05:54    Studies: CT head 1/12  >> moderate chronic small vessel ischemic disease and cerebral atrophy  Lines/tubes: Trach >> Lt [place] CVL 1/12 >>   Cultures: Blood 1/12 >> Sputum 1/12 >> Urine 1/12 >>  Antibiotics: Vancomycin 1/12 >> Zosyn 1/12 >>  Events: 1/12 Transfer from [facility] 1/13 Off pressors  Summary: 82 yo female s/p cardiac arrest 08/17/16 with anoxic encephalopathy and persistent vegetative state.  She was at [facility] in [place], [place] and then transferred to [facility] in [place], [place].  She developed PEA arrest 1/12 and transferred to [facility].  PMHx of CAD, DM, GI bleed, HTN  Assessment/plan:  Recurrent cardiac arrest. - recommend conservative management strategy  Anoxic encephalopathy with persistent vegetative state. - prn versed, fentanyl  Acute on chronic hypoxic respiratory failure. Failure to wean s/p tracheostomy with Brovona armored trach. - f/u CXR - full vent support  Sepsis with lactic acidosis and possible HCAP. - continue Abx - f/u lactic acid, cultures - goal SBP > 85  Pressure ulcers. - present prior to this admission - wound care  DM type II. - SSI  DVT prophylaxis - SCDs SUP - pepcid Nutrition - tube feeds Goals of care - full code.  Consult palliative care  CC time 32 minutes  Coralyn Helling, MD [facility] Pulmonary/Critical Care 09/13/2016, 7:58 AM Pager:  [phone] After 3pm call: [phone]

## 2016-09-13 NOTE — Progress Notes (Signed)
Patient blood sugar re-checked and is within normal range. Insulin held for 0000.   Carrie Usery E Mylinda LatinaNino Combs, [place]RN

## 2016-09-13 NOTE — Progress Notes (Signed)
eLink Physician-Brief Progress Note Patient Name: Ana Brown DOB: [birth year] MRN: [id number]   Date of Service  09/13/2016  HPI/Events of Note  Downward trending blood pressure. Previously taken off vasopressor support and central venous access. Currently on FiO2 0.4.   eICU Interventions  1. Bolus normal saline 1 L 2. May need to restart vasopressor depending upon blood pressure response      Intervention Category Intermediate Interventions: Hypotension - evaluation and management  Lawanda CousinsJennings Genifer Lazenby 09/13/2016, 1:37 AM

## 2016-09-13 NOTE — Progress Notes (Signed)
Nutrition Follow-up  DOCUMENTATION CODES:  Obesity unspecified  INTERVENTION:  PEPup protocol. Vital High Protein @ 25 ml/hr (600 ml/day) 30 ml Prostat TID Provides: 900 kcal, 97 grams protein, and 501 ml H2O.   NUTRITION DIAGNOSIS:  Inadequate oral intake related to inability to eat as evidenced by NPO status.  GOAL:  Provide needs based on ASPEN/SCCM guidelines  MONITOR:  I & O's, Skin, Vent status  REASON FOR ASSESSMENT:  Consult Assessment of nutrition requirement/status (TF recommendations)  ASSESSMENT:  Pt with PMH of CAD, CHF, DM, HTN, recent cardiac arrest in [place] which resulted in vegetative state with trach/PEG now admitted after cardiac arrest at her facility  Pt has been vent dependent x 1 month. Persistent vegetative state.   Her admit weight reflects a weight loss of 13 lbs (7.5% bw) [facility] 1 month [facility]. This is a clinically significant loss. Given recent transition to a vegetative state, likely lost wt from muscle atrophy  Abdomen: Soft, non distended  Patient is currently intubated on ventilator support MV: 5.4 L/min Temp (24hrs), Avg:97.6 F (36.4 C), Min:97.2 F (36.2 C), Max:98.1 F (36.7 C)  Propofol: None  Medications: Pepcid, IV ABX,  Labs: Intermittent Hyperglycemia, WBC: 12.9   Recent Labs Lab 09/01/2016 0137 09/08/2016 0708 09/13/16 0345 09/13/16 0800  NA 136 136 139  --   K 4.9 4.5 3.7  --   CL 95* 96* 102  --   CO2 29 29 28   --   BUN 32* 31* 27*  --   CREATININE 1.02* 0.96 0.82  --   CALCIUM 9.7 9.7 9.1  --   MG 2.1 1.8  --  1.8  PHOS  --  3.5  --  3.4  GLUCOSE 203* 333* 85  --    Diet Order:  Diet NPO time specified  Skin:  Laceration to shoulder MSAD to buttocks/groin, stage 1 to trach site stage III sacrum/  Last BM:  unknown  Height:  Ht Readings from Last 1 Encounters:  09/30/2016 5' (1.524 m)   Weight:  Wt Readings from Last 1 Encounters:  09/13/16 166 lb 3.6 oz (75.4 kg)    Wt Readings from Last 10 Encounters:  09/13/16 166 lb 3.6 oz (75.4 kg)  Admit weight: 157 lbs (71.36 kg)  Per Care Everywhere: Pt weighed 170.6 lbs (77.4 kg) on 12/11 at time of her previous admission.   Ideal Body Weight:  45.4 kg  BMI:  Body mass index is 32.46 kg/m.  Estimated Nutritional Needs:  Kcal:  783-996 Protein:  >/= 90 grams Fluid:  > 1.5 L/day  EDUCATION NEEDS:  No education needs identified at this time  Christophe LouisNathan Qasim Diveley RD, LDN, CNSC Clinical Nutrition Pager: [phone] 09/13/2016 10:40 AM

## 2016-09-13 NOTE — Progress Notes (Signed)
eLink Physician-Brief Progress Note Patient Name: Ana Brown DOB: 10/07/1934 MRN: [id number]   Date of Service  09/13/2016  HPI/Events of Note  Hypoglycemia requiring repeat dextrose bolus.   eICU Interventions  Switching to D5 with half normal saline at 75 mL per hour      Intervention Category Intermediate Interventions: Other:  Lawanda CousinsJennings Mujahid Jalomo 09/13/2016, 4:09 AM

## 2016-09-13 NOTE — Progress Notes (Signed)
Pt blood sugar 48 when checked at 0000. 25 ml D 50 given. Will re-check blood sugar and continue to monitor patient.   Zakeya Junker E Mylinda LatinaNino Combs, [place]RN

## 2016-09-13 NOTE — Progress Notes (Signed)
Hypoglycemic Event  CBG: 60  Treatment: D50 IV 25 mL  Symptoms: None  Follow-up CBG: Time:0430 CBG Result:146 (finger stick)  Possible Reasons for Event: Unknown  Comments/MD notified:E-Link MD notified; pt was started on D5 with half NS to treat her hypoglycemia.   Let E-Link MD know about latest blood sugar; waiting for further orders.   Will continue to monitor pt.  Ana Brown E Nino Combs

## 2016-09-13 NOTE — Progress Notes (Signed)
Blood pressures low with MAPs <65. Notified Ana-Link MD. Received orders to give 1 L bolus.   Checked CVP = 6 prior to giving fluid bolus.   Patient currently receiving 1 L bolus. Will continue to monitor blood pressure and will notify MD if any changes occur.   Ana Brown Ana Brown, [place]RN

## 2016-09-13 NOTE — Progress Notes (Signed)
No charge note.   Palliative consult request received.  Chart reviewed: 82 year old lady with PEA arrest, history of cardiac arrest in December 2017, s/p trach/peg placement. A palliative consult has been placed for goals of care discussions with family. Patient has 4 children.  Patient seen briefly, discussed with bedside RN. Call placed and discussed with daughter Ana Brown [phone]. I have given her our palliative number [phone], await call back regarding time/date of family meeting.  Full note and further recommendations to follow.   Ana HawkingZeba Marcin Holte MD [facility] palliative medicine team [phone]

## 2016-09-13 NOTE — Progress Notes (Signed)
Upon arrival to patient room, patient ventilator was alarming high peak pressure.  Patient was noted to have a leak in airway.  Was unable to pass suction catheter.  Sats were maintaining at 100%.  Deflated cuff and advanced trach a slight amount and was then able to pass suction catheter and numbers on ventilator slightly improved.  Decreased trigger to -2.  Patient is still noted to have small leak, although cuff is adequately inflated.  MD is aware and will order chest xray to see trach placement.  Will continue to monitor.

## 2016-09-13 NOTE — Progress Notes (Signed)
eLink Physician-Brief Progress Note Patient Name: Ana Brown DOB: 12/27/1934 MRN: [id number]   Date of Service  09/13/2016  HPI/Events of Note  Hypothermia - Temp = 94.5 F.   eICU Interventions  Will order [employer]ger.      Intervention Category Intermediate Interventions: Other:  Lenell AntuSommer,Steven Eugene 09/13/2016, 9:47 PM

## 2016-09-14 ENCOUNTER — Encounter (HOSPITAL_COMMUNITY): Payer: Self-pay | Admitting: *Deleted

## 2016-09-14 DIAGNOSIS — J9601 Acute respiratory failure with hypoxia: Secondary | ICD-10-CM

## 2016-09-14 DIAGNOSIS — J9690 Respiratory failure, unspecified, unspecified whether with hypoxia or hypercapnia: Secondary | ICD-10-CM

## 2016-09-14 DIAGNOSIS — R403 Persistent vegetative state: Secondary | ICD-10-CM

## 2016-09-14 LAB — BASIC METABOLIC PANEL
Anion gap: 8 (ref 5–15)
BUN: 35 mg/dL — ABNORMAL HIGH (ref 6–20)
CHLORIDE: 104 mmol/L (ref 101–111)
CO2: 29 mmol/L (ref 22–32)
CREATININE: 1.09 mg/dL — AB (ref 0.44–1.00)
Calcium: 9 mg/dL (ref 8.9–10.3)
GFR calc non Af Amer: 47 mL/min — ABNORMAL LOW (ref >60)
GFR, EST AFRICAN AMERICAN: 54 mL/min — AB (ref >60)
Glucose, Bld: 138 mg/dL — ABNORMAL HIGH (ref 65–99)
POTASSIUM: 4.1 mmol/L (ref 3.5–5.1)
Sodium: 141 mmol/L (ref 135–145)

## 2016-09-14 LAB — BLOOD GAS, ARTERIAL
ACID-BASE DEFICIT: 0.8 mmol/L (ref 0.0–2.0)
Acid-base deficit: 1.1 mmol/L (ref 0.0–2.0)
Acid-base deficit: 1.3 mmol/L (ref 0.0–2.0)
BICARBONATE: 29.3 mmol/L — AB (ref 20.0–28.0)
BICARBONATE: 26.6 mmol/L (ref 20.0–28.0)
Bicarbonate: 28.8 mmol/L — ABNORMAL HIGH (ref 20.0–28.0)
DRAWN BY: 252031
Drawn by: 257591
Drawn by: 252031
FIO2: 40
FIO2: 40
FIO2: 0.4
LHR: 22 {breaths}/min
LHR: 26 {breaths}/min
O2 SAT: 96.7 %
O2 SAT: 95.8 %
O2 Saturation: 94.8 %
PATIENT TEMPERATURE: 98.6
PATIENT TEMPERATURE: 98.6
PATIENT TEMPERATURE: 98.6
PCO2 ART: 116 mmHg — AB (ref 32.0–48.0)
PCO2 ART: 112 mmHg — AB (ref 32.0–48.0)
PCO2 ART: 78.7 mmHg — AB (ref 32.0–48.0)
PEEP/CPAP: 5 cmH2O
PEEP: 5 cmH2O
PEEP: 5 cmH2O
PH ART: 7.032 — AB (ref 7.350–7.450)
PO2 ART: 96.6 mmHg (ref 83.0–108.0)
PRESSURE CONTROL: 40 cmH2O
Pressure control: 40 cmH2O
RATE: 30 resp/min
VT: 300 mL
pH, Arterial: 7.041 — CL (ref 7.350–7.450)
pH, Arterial: 7.155 — CL (ref 7.350–7.450)
pO2, Arterial: 119 mmHg — ABNORMAL HIGH (ref 83.0–108.0)
pO2, Arterial: 88.1 mmHg (ref 83.0–108.0)

## 2016-09-14 LAB — CBC
HEMATOCRIT: 31.4 % — AB (ref 36.0–46.0)
HEMOGLOBIN: 9.3 g/dL — AB (ref 12.0–15.0)
MCH: 26 pg (ref 26.0–34.0)
MCHC: 29.6 g/dL — ABNORMAL LOW (ref 30.0–36.0)
MCV: 88.1 fL (ref 78.0–100.0)
PLATELETS: 496 10*3/uL — AB (ref 150–400)
RBC: 3.58 MIL/uL — AB (ref 3.87–5.11)
RDW: 19.8 % — ABNORMAL HIGH (ref 11.5–15.5)
WBC: 25.6 10*3/uL — ABNORMAL HIGH (ref 4.0–10.5)

## 2016-09-14 LAB — GLUCOSE, CAPILLARY
GLUCOSE-CAPILLARY: 131 mg/dL — AB (ref 65–99)
Glucose-Capillary: 218 mg/dL — ABNORMAL HIGH (ref 65–99)
Glucose-Capillary: 267 mg/dL — ABNORMAL HIGH (ref 65–99)
Glucose-Capillary: 217 mg/dL — ABNORMAL HIGH (ref 65–99)
Glucose-Capillary: 209 mg/dL — ABNORMAL HIGH (ref 65–99)

## 2016-09-14 LAB — MAGNESIUM
Magnesium: 1.9 mg/dL (ref 1.7–2.4)
Magnesium: 1.8 mg/dL (ref 1.7–2.4)

## 2016-09-14 LAB — PHOSPHORUS
Phosphorus: 7.1 mg/dL — ABNORMAL HIGH (ref 2.5–4.6)
Phosphorus: 5.1 mg/dL — ABNORMAL HIGH (ref 2.5–4.6)

## 2016-09-14 LAB — CORTISOL: Cortisol, Plasma: 41.9 ug/dL

## 2016-09-14 MED ORDER — ALBUTEROL SULFATE (2.5 MG/3ML) 0.083% IN NEBU
INHALATION_SOLUTION | RESPIRATORY_TRACT | Status: AC
  Administered 2016-09-14: 2.5 mg
  Filled 2016-09-14: qty 6

## 2016-09-14 MED ORDER — INSULIN ASPART 100 UNIT/ML ~~LOC~~ SOLN
0.0000 [IU] | SUBCUTANEOUS | Status: DC
  Administered 2016-09-14: 7 [IU] via SUBCUTANEOUS
  Administered 2016-09-14: 11 [IU] via SUBCUTANEOUS
  Administered 2016-09-14 – 2016-09-15 (×4): 7 [IU] via SUBCUTANEOUS
  Administered 2016-09-15: 4 [IU] via SUBCUTANEOUS
  Administered 2016-09-15: 3 [IU] via SUBCUTANEOUS
  Administered 2016-09-15: 4 [IU] via SUBCUTANEOUS
  Administered 2016-09-16 (×3): 3 [IU] via SUBCUTANEOUS

## 2016-09-14 MED ORDER — ALBUTEROL SULFATE (2.5 MG/3ML) 0.083% IN NEBU
5.0000 mg | INHALATION_SOLUTION | RESPIRATORY_TRACT | Status: DC | PRN
  Administered 2016-09-14: 5 mg via RESPIRATORY_TRACT

## 2016-09-14 MED ORDER — HYDROCERIN EX CREA
TOPICAL_CREAM | Freq: Two times a day (BID) | CUTANEOUS | Status: DC
  Administered 2016-09-14 – 2016-09-16 (×5): via TOPICAL
  Filled 2016-09-14: qty 113

## 2016-09-14 MED ORDER — PROPOFOL 1000 MG/100ML IV EMUL
5.0000 ug/kg/min | INTRAVENOUS | Status: DC
  Administered 2016-09-14: 5 ug/kg/min via INTRAVENOUS
  Administered 2016-09-15: 10 ug/kg/min via INTRAVENOUS
  Administered 2016-09-15: 5 ug/kg/min via INTRAVENOUS
  Filled 2016-09-14 (×2): qty 100

## 2016-09-14 MED ORDER — SODIUM CHLORIDE 0.9 % IV BOLUS (SEPSIS)
1000.0000 mL | Freq: Once | INTRAVENOUS | Status: AC
  Administered 2016-09-14: 1000 mL via INTRAVENOUS

## 2016-09-14 MED ORDER — FENTANYL CITRATE (PF) 100 MCG/2ML IJ SOLN
100.0000 ug | Freq: Once | INTRAMUSCULAR | Status: AC
  Administered 2016-09-14: 100 ug via INTRAVENOUS

## 2016-09-14 MED ORDER — HEPARIN SODIUM (PORCINE) 5000 UNIT/ML IJ SOLN
5000.0000 [IU] | Freq: Three times a day (TID) | INTRAMUSCULAR | Status: DC
  Administered 2016-09-14 – 2016-09-16 (×7): 5000 [IU] via SUBCUTANEOUS
  Filled 2016-09-14 (×7): qty 1

## 2016-09-14 MED ORDER — DEXTROSE 5 % IV SOLN
0.0000 ug/min | INTRAVENOUS | Status: DC
  Administered 2016-09-14: 15 ug/min via INTRAVENOUS
  Administered 2016-09-14: 20 ug/min via INTRAVENOUS
  Administered 2016-09-15: 6 ug/min via INTRAVENOUS
  Filled 2016-09-14 (×2): qty 16

## 2016-09-14 MED ORDER — CLOTRIMAZOLE 10 MG MT TROC
10.0000 mg | Freq: Every day | OROMUCOSAL | Status: DC
  Administered 2016-09-14 – 2016-09-15 (×4): 10 mg via ORAL
  Filled 2016-09-14 (×8): qty 1

## 2016-09-14 MED ORDER — PROPOFOL 1000 MG/100ML IV EMUL
INTRAVENOUS | Status: AC
  Filled 2016-09-14: qty 100

## 2016-09-14 NOTE — Progress Notes (Signed)
Pt temp rectal temp 94.3 F.  Notified E-Link MD; received orders to use Bair Hugger on patient.  Will continue to monitor patient temp.  Jessicalynn Deshong E Mylinda LatinaNino Combs, [place]RN

## 2016-09-14 NOTE — Progress Notes (Signed)
No charge note  Received call back from daughter later yesterday evening.  Family meeting to discuss goals of care scheduled for noon on 09-14-16.  Full note and final recommendations to follow Thank you for the consult  Rosalin HawkingZeba Evaristo Tsuda MD [facility] palliative medicine [phone]

## 2016-09-14 NOTE — Progress Notes (Signed)
PCCM Progress Note  Subjective:  Overnight, patient was asynchronous on the vent while on pressure control. ABG revealed respiratory hypercarbic acidosis with pH of 7.04, pCO2 112, and pO2 of 96.6 on 300/40/22/26. Rate was increased to 30. Resulting ABG showed mild improvement to pH of 7.15, pCO2 78.7, pO2 88.1. [facility] with 300/20/25/5. Patient was started on propofol overnight and levophed for hypotension after persistence after a 1L NS bolus. She is unresponsive on my exam.   Vitals: BP 120/60   Pulse 94   Temp 97.5 F (36.4 C) (Oral)   Resp (!) 26   Ht 5' (1.524 m)   Wt 181 lb 14.1 oz (82.5 kg)   SpO2 100%   BMI 35.52 kg/m   Intake/outpt: I/O last 3 completed shifts: In: 4253.3 [I.V.:955.3; Other:260; NG/GT:488; IV Piggyback:2550] Out: 375 [Urine:375]  General: on vent, ill appearing Neuro: doesn't follow commands, grimaces with painful stimulation, withdraws to pain in lower extremities Eyes: pupils pinpoint with minimal reaction ENT: trach site with clear secretions, thrush Cardiac: regular rate, regular rhythm, no murmur Chest: expiratory wheezes, no rales or rhonchi Abd: soft, non distended Ext: 1+ edema Skin: pressure ulcers present prior to this admission   CMP Latest Ref Rng & Units 09/14/2016 09/13/2016 09/24/2016  Glucose 65 - 99 mg/dL 409(W138(H) 85 119(J333(H)  BUN 6 - 20 mg/dL 47(W35(H) 29(F27(H) 62(Z31(H)  Creatinine 0.44 - 1.00 mg/dL 3.08(M1.09(H) 5.780.82 4.690.96  Sodium 135 - 145 mmol/L 141 139 136  Potassium 3.5 - 5.1 mmol/L 4.1 3.7 4.5  Chloride 101 - 111 mmol/L 104 102 96(L)  CO2 22 - 32 mmol/L 29 28 29   Calcium 8.9 - 10.3 mg/dL 9.0 9.1 9.7  Total Protein 6.5 - 8.1 g/dL - - 7.3  Total Bilirubin 0.3 - 1.2 mg/dL - - 1.2  Alkaline Phos 38 - 126 U/L - - 142(H)  AST 15 - 41 U/L - - 92(H)  ALT 14 - 54 U/L - - 101(H)    CBC Latest Ref Rng & Units 09/14/2016 09/13/2016 09/24/2016  WBC 4.0 - 10.5 K/uL 25.6(H) 12.9(H) 23.5(H)  Hemoglobin 12.0 - 15.0 g/dL 6.2(X9.3(L) 5.2(W8.4(L)  4.1(L9.7(L)  Hematocrit 36.0 - 46.0 % 31.4(L) 26.7(L) 31.7(L)  Platelets 150 - 400 K/uL 496(H) 350 412(H)    ABG    Component Value Date/Time   PHART 7.155 (LL) 09/14/2016 0655   PCO2ART 78.7 (HH) 09/14/2016 0655   PO2ART 88.1 09/14/2016 0655   HCO3 26.6 09/14/2016 0655   TCO2 30 09/28/2016 0736   ACIDBASEDEF 1.3 09/14/2016 0655   O2SAT 95.8 09/14/2016 0655    CBG (last 3)   Recent Labs  09/13/16 1937 09/13/16 2313 09/14/16 0349  GLUCAP 171* 137* 131*    Imaging: Ct Head Wo Contrast  Result Date: 09/04/2016 CLINICAL DATA:  Cardiac arrest today. Anoxic brain injury from cardiac arrest 07/2016. EXAM: CT HEAD WITHOUT CONTRAST TECHNIQUE: Contiguous axial images were obtained from the base of the skull through the vertex without intravenous contrast. COMPARISON:  None. FINDINGS: Brain: There is no evidence of acute cortical infarct, intracranial hemorrhage, mass, midline shift, or extra-axial fluid collection. Ventricular enlargement is favored to reflect central predominant cerebral atrophy of a moderate degree. With patchy to confluent cerebral white matter hypodensities are nonspecific but compatible with moderately extensive chronic small vessel ischemic disease. Vascular: Calcified atherosclerosis at the skullbase. No hyperdense vessel. Skull: No fracture or focal osseous lesion. Sinuses/Orbits: Prior bilateral cataract extraction. Small volume fluid in the maxillary sinuses. Moderate bilateral ethmoid sinus  mucosal thickening. Trace mastoid effusions. Other: None. IMPRESSION: 1. No evidence of acute intracranial abnormality. 2. Moderate chronic small vessel ischemic disease and cerebral atrophy Electronically Signed   By: Sebastian Ache M.D.   On: 10-02-2016 08:36   Dg Chest Port 1 View  Result Date: 09/13/2016 CLINICAL DATA:  Respiratory failure EXAM: PORTABLE CHEST 1 VIEW COMPARISON:  2016-10-02 FINDINGS: The tracheostomy tube is in good position. A transcutaneous pacer overlies  the left chest. A left subclavian line is stable. No pneumothorax. No overt edema. No other interval changes. IMPRESSION: No interval change. Electronically Signed   By: Gerome Sam III M.D   On: 09/13/2016 09:42   Studies: CT head 1/12 >> moderate chronic small vessel ischemic disease and cerebral atrophy CXR 1/13 >> The tracheostomy tube is in good position. A transcutaneous pacer overlies the left chest. A left subclavian line is stable. No pneumothorax. No overt edema. No other interval changes.  Lines/tubes: Trach >> Lt [place] CVL 1/12 >>   Cultures: Blood 1/12 >> NGTD Sputum 1/12 >> pending Urine 1/12 >> NGTD   Antibiotics: Vancomycin 1/12 >> Zosyn 1/12 >>  Events: 1/12 Transfer from [facility] 1/13 Off pressors 1/14 On pressors  Summary: 82 yo female s/p cardiac arrest 08/17/16 with anoxic encephalopathy and persistent vegetative state.  She was at [facility] in [place], [place] and then transferred to [facility] in [place], [place].  She developed PEA arrest 1/12 and transferred to [facility].  PMHx of CAD, DM, GI bleed, HTN   Assessment/plan:   Recurrent cardiac arrest. - recommend conservative management strategy - palliative care consulted, appreciate assistance  Anoxic encephalopathy with persistent vegetative state. - prn versed, fentanyl - restarted on propofol gtt  Acute on chronic hypoxic hypercarbic respiratory failure. Failure to wean s/p tracheostomy with Brovona armored trach. - full vent support - albuterol q2h prn wheezing  Sepsis with lactic acidosis and possible HCAP > hypothermic overnight, requiring bair hugger, WBC 22 >12 >23, cultures negative so far, cortisol level elevated Oral Thrush  - continue Vanc/Zosyn - on levophed, goal SBP > 85 - start clotrimazole troches  Pressure ulcers. - present prior to this admission - wound care  DM type II. - SSI Q4H - CBG Q4H  DVT prophylaxis - SCDs SUP - pepcid Nutrition - tube feeds Goals of care - full code.   Palliative care consulted  Karlene Lineman, DO PGY-3 Internal Medicine Resident Pager # [phone] 09/14/2016 7:46 AM

## 2016-09-14 NOTE — Progress Notes (Signed)
eLink Physician-Brief Progress Note Patient Name: Ana LittlerMinnie R. Eulah PontMurphy DOB: 09-Feb-1935 MRN: [id number]   Date of Service  09/14/2016  HPI/Events of Note  Nurse notified of borderline hypotension. Currently with perihilar in place for hypothermia.   eICU Interventions  Normal saline 1 L IV fluid bolus      Intervention Category Major Interventions: Hypotension - evaluation and management  Lawanda CousinsJennings Blaze Nylund 09/14/2016, 12:54 AM

## 2016-09-14 NOTE — Progress Notes (Signed)
eLink Physician-Brief Progress Note Patient Name: Reather LittlerMinnie R. Eulah PontMurphy DOB: [birth year] MRN: [id number]   Date of Service  09/14/2016  HPI/Events of Note  Persistent hypotension despite IV fluid bolus. Currently on broad-spectrum antibiotics with vancomycin and Zosyn. Has central venous access.   eICU Interventions  1. Start Levophed and titrate for goal systolic blood pressure & mean arterial pressure 2. Sending stat cortisol level      Intervention Category Major Interventions: Hypotension - evaluation and management  Lawanda CousinsJennings Arsalan Brisbin 09/14/2016, 1:31 AM

## 2016-09-14 NOTE — Progress Notes (Signed)
MD at bedside assessed patient and ordered for me to give 100 mg of Fentanyl and start pt on Propofol gtt.   Patient stable & appears comfortable.   Will continue to monitor pt and will notify MD if changes occur.   Dmitry Macomber E Mylinda LatinaNino Combs, [place]

## 2016-09-14 NOTE — Consult Note (Signed)
WOC Nurse wound consult note Reason for Consult: Moisture lesion in the intragluteal space, skin tear on right shoulder Wound type: Moisture associated skin damage (MASD), specifically intertriginous dermatitis (ITD).  Trauma. Pressure Injury POA: No Measurement: Intragluteal cleft:  3cm x 0.5cm x 0.2cm full thickness with red wound bed, mild maceration in the periwound area, scant serous drainage.  No odor. Right shoulder: 2cm x 1.5cm x 0.1cm with red wound bed, scant serous exudate, no odor.  Discoloration (increased pigment, darker hue) in the periwound area Wound bed:As described above Drainage (amount, consistency, odor) As described above. Periwound: As described above Dressing procedure/placement/frequency: Nursing is provided with guidance via the Orders for topical care to the two areas:  A silicone foam for the shoulder and while we will continue the silicone foam to the buttocks, we will add a ribbon of our house antimicrobial wicking textile ([employer] to manage moisture collection between the buttocks that, together with skin-on-skin-friction has created the moisture lesion that is full thickness.  Pressure redistribution heel boots are provided for the bilateral feet as they have sustained amputations and there is a Charcot foot deformity.  Both feel are very dry, so a moisturizing regemen is initiated.  WOC nursing team will not follow, but will remain available to this patient, the nursing and medical teams.  Please re-consult if needed. Thanks, Ladona MowLaurie Merary Garguilo, MSN, RN, GNP, Hans EdenCWOCN, CWON-AP, FAAN  Pager# [phone]

## 2016-09-14 NOTE — Progress Notes (Signed)
Repeat ABG results called to Dr. Nestor. RT will continue to monitor.  

## 2016-09-14 NOTE — Progress Notes (Signed)
eLink Physician-Brief Progress Note Patient Name: Ana Brown DOB: June 13, 1935 MRN: [id number]   Date of Service  09/14/2016  HPI/Events of Note  Assessed by intensivist. Reportedly seemed to have isolated peak pressure elevation. Nebulizer treatment administered. Propofol initiated to improve ventilator synchrony.   eICU Interventions  1. Plan for repeat ABG at 7 a.m. 2. Consider CT angiogram of the chest to evaluate for embolism depending upon ABG      Intervention Category Major Interventions: Respiratory failure - evaluation and management  [name] 09/14/2016, 5:39 AM

## 2016-09-14 NOTE — Consult Note (Signed)
Consultation Note Date: 09/14/2016   Patient Name: Ana Brown  DOB: 14-Jan-1935  MRN: [id number]  Age / Sex: 82 y.o., female  PCP: Verneita Griffes, MD Referring Physician: Collene Gobble, MD  Reason for Consultation: Establishing goals of care  HPI/Patient Profile: 82 y.o. female  with past medical history of Diabetes, congestive heart failure, non-ST segment elevation MI, degenerative joint disease of the hip, PEA arrest, ventilator dependent respiratory failure, GI bleed, status post tracheostomy and PEG tube placement in [place], [place], subsequently [facility] to [facility] in [place]  admitted on 09/25/2016 with another pulseless electrical activity arrest. Patient with likely anoxic encephalopathy, persistent vegetative state, has pressure ulcers.  Patient admitted with acute on chronic respiratory failure, sepsis secondary to healthcare associated pneumonia. Palliative care consultation for goals of care discussions.   Clinical Assessment and Goals of Care:  Patient and family are originally from [place], [place]. Unfortunately, on 07/23/16, patient suffered cardiac arrest and prolonged hospitalization in [place], [place]. [facility] has [facility] at [facility] and subsequently suffered another PEA arrest. The patient's baseline is such that earlier, prior to this acute PEA arrest, she was able to track with her eyes and recognize her children when they came to visit her.  Patient has 4 children. Palliative consult request received. Chart reviewed extensively, including care everywhere-records from [facility]. Call placed and discussed with daughter Bradly Bienenstock in evening on 1-13. Family meeting set for known at 09/14/16.  I met with the patient's 4 children 2 sons and 2 daughters, daughter-in-law, grand daughter. Patient seen and examined earlier,  beforehand. Patient with rapid shallow breathing, withdrawing only to pain. Pertinent blood work and imaging reviewed. Patient with acidosis on ABG, CT scan showing moderate atrophy and chronic small vessel ischemia.  I introduced myself and palliative care as follows: Palliative medicine is specialized medical care for people living with serious illness. It focuses on providing relief from the symptoms and stress of a serious illness. The goal is to improve quality of life for both the patient and the family.  see discussions below  NEXT OF KIN    Discussions/SUMMARY OF RECOMMENDATIONS    Family meeting with the patient's 4 children outside of the consult room. Patient would not open eyes, would not follow commands even when one of the patient's daughter held her hand and called her name out loud in the patient's room.  We met outside the room and the consult room. Discussed extensively about the patient being seriously ill and having a high risk for no significant/meaningful recovery. We discussed about the patient's persistent vegetative state, anoxic encephalopathy. We discussed about patient's repeated bradycardic episodes, PEA arrests. Patient's past medical history was such that she had a few chronic medical conditions prior to her cardiac arrest towards the end of November 2017. We discussed about what the patient would want in this situation.  Specifically, CODE STATUS discussions undertaken. We discussed about DO NOT RESUSCITATE/compassionate liberation from tracheostomy, discontinuation of artificial nutrition and hydration. Concepts specific 2.  Of comfort measures discussed in great detail. Family is tearful but does not voice much in the way of opinions/preferences. All of their questions answered to the best of my ability. Palliative services will continue to follow along and help guide appropriate decision-making.   Code Status/Advance Care Planning:  Full code    Symptom  Management:    continue current scope of treatment.   Palliative Prophylaxis:   Delirium Protocol  Additional Recommendations (Limitations, Scope, Preferences):  Full Scope Treatment  Psycho-social/Spiritual:   Desire for further Chaplaincy support:yes  Additional Recommendations: Education on Hospice  Prognosis:   Unable to determine  Discharge Planning: To Be Determined      Primary Diagnoses: Present on Admission: . Cardiac arrest (HCC)   I have reviewed the medical record, interviewed the patient and family, and examined the patient. The following aspects are pertinent.  Past Medical History:  Diagnosis Date  . Anoxic brain injury (HCC)   . Blood transfusion without reported diagnosis   . Cardiac arrest (HCC)   . CHF (congestive heart failure) (HCC)   . Coronary artery disease   . Diabetes mellitus without complication (HCC)   . GI bleed   . Hypertension   . NSTEMI (non-ST elevated myocardial infarction) [facility])    Social History   Social History  . Marital status: Widowed    Spouse name: N/A  . Number of children: N/A  . Years of education: N/A   Social History Main Topics  . Smoking status: Never Smoker  . Smokeless tobacco: None  . Alcohol use No  . Drug use: No  . Sexual activity: Not Asked   Other Topics Concern  . None   Social History Narrative  . None   History reviewed. No pertinent family history. Scheduled Meds: . atorvastatin  80 mg Per Tube Daily  . clotrimazole  10 mg Oral 5 X Daily  . famotidine  20 mg Per Tube BID  . feeding supplement (PRO-STAT SUGAR FREE 64)  30 mL Per Tube TID  . feeding supplement (VITAL HIGH PROTEIN)  1,000 mL Per Tube Q24H  . heparin subcutaneous  5,000 Units Subcutaneous Q8H  . hydrocerin   Topical BID  . insulin aspart  0-20 Units Subcutaneous Q4H  . mouth rinse  15 mL Mouth Rinse 10 times per day  . piperacillin-tazobactam (ZOSYN)  IV  3.375 g Intravenous Q8H  . sodium chloride flush  10-40 mL  Intracatheter Q12H  . vancomycin  750 mg Intravenous Q24H   Continuous Infusions: . dextrose 5 % and 0.45% NaCl Stopped (09/13/16 1003)  . norepinephrine (LEVOPHED) Adult infusion Stopped (09/14/16 1100)  . propofol (DIPRIVAN) infusion 5 mcg/kg/min (09/14/16 0532)   PRN Meds:.acetaminophen, albuterol, fentaNYL (SUBLIMAZE) injection, midazolam, sodium chloride flush Medications Prior to Admission:  Prior to Admission medications   Medication Sig Start Date End Date Taking? Authorizing Provider  acetaminophen (TYLENOL) 325 MG tablet Place 650 mg into feeding tube every 6 (six) hours as needed for mild pain or fever.   Yes Historical Provider, MD  Ascorbic Acid (VITAMIN C PO) Place 1 tablet into feeding tube daily.   Yes Historical Provider, MD  atorvastatin (LIPITOR) 80 MG tablet Place 80 mg into feeding tube daily.   Yes Historical Provider, MD  ferrous sulfate 300 (60 Fe) MG/5ML syrup Place 300 mg into feeding tube daily.   Yes Historical Provider, MD  insulin aspart (NOVOLOG) 100 UNIT/ML injection Inject 3-20 Units into the skin every 6 (six) hours. Sliding scale If  151-200 = 3 units 201-300 = 5 units 301-400= 10 units 401-500= 15 units Greater than 500 give 20 units and repeat blood sugar in one hour   Yes Historical Provider, MD  insulin detemir (LEVEMIR) 100 UNIT/ML injection Inject 15 Units into the skin at bedtime.   Yes Historical Provider, MD  ipratropium-albuterol (DUONEB) 0.5-2.5 (3) MG/3ML SOLN Take 3 mLs by nebulization every 6 (six) hours.   Yes Historical Provider, MD  magnesium oxide (MAG-OX) 400 MG tablet Place 400 mg into feeding tube daily.   Yes Historical Provider, MD  Multiple Vitamin (MULTIVITAMIN) LIQD Place 5 mLs into feeding tube daily.   Yes Historical Provider, MD  pantoprazole sodium (PROTONIX) 40 mg/20 mL PACK Place 40 mg into feeding tube daily.   Yes Historical Provider, MD  Protein POWD Place 1 scoop into feeding tube every 6 (six) hours.   Yes Historical  Provider, MD  tuberculin 5 UNIT/0.1ML injection Inject 5 Units into the skin once.   Yes Historical Provider, MD   Allergies  Allergen Reactions  . Hibiclens [Chlorhexidine Gluconate] Other (See Comments)    On MAR   Review of Systems Non verbal  Physical Exam General: rapid shallow WOB Neuro: vegetative state Eyes: pupils mid point ENT: trach site clean Cardiac: regular, no murmur Chest: no wheeze Abd: soft, non tender Ext: no edema Skin: pressure ulcers Vital Signs: BP (!) 101/46 (BP Location: Left Arm)   Pulse 89   Temp 98.1 F (36.7 C) (Oral)   Resp (!) 27   Ht 5' (1.524 m)   Wt 82.5 kg (181 lb 14.1 oz)   SpO2 100%   BMI 35.52 kg/m  Pain Assessment: CPOT   Pain Score: 0-No pain   SpO2: SpO2: 100 % O2 Device:SpO2: 100 % O2 Flow Rate: .   IO: Intake/output summary:  Intake/Output Summary (Last 24 hours) at 09/14/16 1307 Last data filed at 09/14/16 1200  Gross per 24 hour  Intake           2114.7 ml  Output              150 ml  Net           1964.7 ml    LBM: Last BM Date: 09/15/2016 Baseline Weight: Weight: 71.2 kg (156 lb 15.5 oz) Most recent weight: Weight: 82.5 kg (181 lb 14.1 oz)     Palliative Assessment/Data:   Flowsheet Rows   Flowsheet Row Most Recent Value  Intake Tab  Referral Department  Critical care  Unit at Time of Referral  ICU  Palliative Care Primary Diagnosis  Other (Comment) [PEA arrest VDRF PEG anoxic brain injury ]  Palliative Care Type  New Palliative care  Reason for referral  Clarify Goals of Care  Date first seen by Palliative Care  09/13/16  Clinical Assessment  Palliative Performance Scale Score  20%  Pain Max last 24 hours  5  Pain Min Last 24 hours  4  Dyspnea Max Last 24 Hours  4  Dyspnea Min Last 24 hours  3  Nausea Max Last 24 Hours  4  Nausea Min Last 24 Hours  3  Anxiety Max Last 24 Hours  4  Psychosocial & Spiritual Assessment  Palliative Care Outcomes  Patient/Family meeting held?  Yes  Who was at the  meeting?  Patient's 4 children, daughter in law, grand daughter   Palliative Care Outcomes  Clarified goals of care      Time In:  52 Time Out:  1310 Time Total:  70 min  Greater than 50%  of this time was spent counseling and coordinating care related to the above assessment and plan.  Signed by: Loistine Chance, MD  [phone]  Please contact Palliative Medicine Team phone at [phone] for questions and concerns.  For individual provider: See Shea Evans

## 2016-09-14 NOTE — Progress Notes (Signed)
eLink Physician-Brief Progress Note Patient Name: Ana Brown DOB: Feb 15, 1935 MRN: [id number]   Date of Service  09/14/2016  HPI/Events of Note  Worsening hypercarbic respiratory failure. Switched to pressure control ventilation due to high peak pressures previously.  eICU Interventions  1.  Increasing ventilator set rate to 30 breaths per minute.  2. Repeat ABG in approximately 1 hour      Intervention Category Major Interventions: Respiratory failure - evaluation and management  Ana Brown 09/14/2016, 2:32 AM

## 2016-09-14 NOTE — Progress Notes (Signed)
Pt blood pressure continued to drop even after giving 1 L fluid bolus.   Called and notified E-Link MD. Received orders to start Levophed.   Levophed started and pressures currently within normal range. Will continue to monitor blood pressure to stay at goal.  Halina MaidensAna E Nino Combs, RN

## 2016-09-15 LAB — BLOOD GAS, ARTERIAL
ACID-BASE EXCESS: 3.5 mmol/L — AB (ref 0.0–2.0)
BICARBONATE: 28.6 mmol/L — AB (ref 20.0–28.0)
DRAWN BY: 419771
FIO2: 40
LHR: 20 {breaths}/min
O2 SAT: 98.9 %
PEEP/CPAP: 5 cmH2O
PH ART: 7.358 (ref 7.350–7.450)
Patient temperature: 98.6
VT: 480 mL
pCO2 arterial: 52.1 mmHg — ABNORMAL HIGH (ref 32.0–48.0)
pO2, Arterial: 149 mmHg — ABNORMAL HIGH (ref 83.0–108.0)

## 2016-09-15 LAB — BASIC METABOLIC PANEL
ANION GAP: 13 (ref 5–15)
BUN: 59 mg/dL — ABNORMAL HIGH (ref 6–20)
CALCIUM: 8.8 mg/dL — AB (ref 8.9–10.3)
CO2: 25 mmol/L (ref 22–32)
Chloride: 101 mmol/L (ref 101–111)
Creatinine, Ser: 1.87 mg/dL — ABNORMAL HIGH (ref 0.44–1.00)
GFR, EST AFRICAN AMERICAN: 28 mL/min — AB (ref >60)
GFR, EST NON AFRICAN AMERICAN: 24 mL/min — AB (ref >60)
Glucose, Bld: 196 mg/dL — ABNORMAL HIGH (ref 65–99)
Potassium: 3.6 mmol/L (ref 3.5–5.1)
Sodium: 139 mmol/L (ref 135–145)

## 2016-09-15 LAB — GLUCOSE, CAPILLARY
GLUCOSE-CAPILLARY: 100 mg/dL — AB (ref 65–99)
GLUCOSE-CAPILLARY: 190 mg/dL — AB (ref 65–99)
GLUCOSE-CAPILLARY: 212 mg/dL — AB (ref 65–99)
GLUCOSE-CAPILLARY: 218 mg/dL — AB (ref 65–99)
GLUCOSE-CAPILLARY: 223 mg/dL — AB (ref 65–99)
GLUCOSE-CAPILLARY: 60 mg/dL — AB (ref 65–99)
Glucose-Capillary: 165 mg/dL — ABNORMAL HIGH (ref 65–99)
Glucose-Capillary: 135 mg/dL — ABNORMAL HIGH (ref 65–99)

## 2016-09-15 LAB — CREATININE, URINE, RANDOM: Creatinine, Urine: 87.19 mg/dL

## 2016-09-15 LAB — SODIUM, URINE, RANDOM: Sodium, Ur: 32 mmol/L

## 2016-09-15 LAB — CBC
HCT: 28.8 % — ABNORMAL LOW (ref 36.0–46.0)
Hemoglobin: 8.6 g/dL — ABNORMAL LOW (ref 12.0–15.0)
MCH: 25.7 pg — ABNORMAL LOW (ref 26.0–34.0)
MCHC: 29.9 g/dL — ABNORMAL LOW (ref 30.0–36.0)
MCV: 86.2 fL (ref 78.0–100.0)
Platelets: 374 10*3/uL (ref 150–400)
RBC: 3.34 MIL/uL — AB (ref 3.87–5.11)
RDW: 19.6 % — ABNORMAL HIGH (ref 11.5–15.5)
WBC: 15.6 10*3/uL — AB (ref 4.0–10.5)

## 2016-09-15 MED ORDER — FLUCONAZOLE 40 MG/ML PO SUSR: 200.0000 mg | Freq: Every day | ORAL | Status: DC

## 2016-09-15 MED ORDER — FLUCONAZOLE 40 MG/ML PO SUSR
200.0000 mg | Freq: Every day | ORAL | Status: DC
  Administered 2016-09-15 – 2016-09-16 (×2): 200 mg
  Filled 2016-09-15 (×2): qty 5

## 2016-09-15 MED ORDER — SODIUM CHLORIDE 0.9 % IV SOLN
INTRAVENOUS | Status: DC
  Administered 2016-09-15: 22:00:00 via INTRAVENOUS

## 2016-09-15 MED ORDER — FAMOTIDINE 40 MG/5ML PO SUSR
20.0000 mg | Freq: Every day | ORAL | Status: DC
Start: 2016-09-16 — End: 2016-09-16
  Administered 2016-09-16: 20 mg
  Filled 2016-09-15: qty 2.5

## 2016-09-15 MED ORDER — PIPERACILLIN-TAZOBACTAM 3.375 G IVPB
3.3750 g | Freq: Two times a day (BID) | INTRAVENOUS | Status: DC
  Administered 2016-09-15 – 2016-09-16 (×2): 3.375 g via INTRAVENOUS
  Filled 2016-09-15 (×2): qty 50

## 2016-09-15 NOTE — Progress Notes (Signed)
PCCM Progress Note  Subjective:  Palliative met with patient's family yesterday and had a long discussion regarding prognosis, code status and comfort care; however, patient's family took information but did not make any decisions at that time. No family at bedside this morning.   Vitals: BP (!) 117/43 (BP Location: Right Arm)   Pulse 88   Temp 98.5 F (36.9 C) (Oral)   Resp (!) 26   Ht 5' (1.524 m)   Wt 173 lb 8 oz (78.7 kg)   SpO2 100%   BMI 33.88 kg/m   Intake/outpt: I/O last 3 completed shifts: In: 2870 [I.V.:250; Other:320; NG/GT:750; IV Piggyback:1550] Out: 735 [Urine:535; Drains:200]  General: on vent, ill appearing Neuro: doesn't follow commands, does not withdraw to pain Eyes: pupils pinpoint with minimal reaction ENT: trach site with clear secretions, thrush improved Cardiac: regular rate, regular rhythm, no murmur Chest: expiratory wheezes, no rales or rhonchi Abd: soft, non distended Ext: 1+ edema   CMP Latest Ref Rng & Units 09/15/2016 09/14/2016 09/13/2016  Glucose 65 - 99 mg/dL 196(H) 138(H) 85  BUN 6 - 20 mg/dL 59(H) 35(H) 27(H)  Creatinine 0.44 - 1.00 mg/dL 1.87(H) 1.09(H) 0.82  Sodium 135 - 145 mmol/L 139 141 139  Potassium 3.5 - 5.1 mmol/L 3.6 4.1 3.7  Chloride 101 - 111 mmol/L 101 104 102  CO2 22 - 32 mmol/L _0 Calcium 8.9 - 10.3 mg/dL 8.8(L) 9.0 9.1  Total Protein 6.5 - 8.1 g/dL - - -  Total Bilirubin 0.3 - 1.2 mg/dL - - -  Alkaline Phos 38 - 126 U/L - - -  AST 15 - 41 U/L - - -  ALT 14 - 54 U/L - - -    CBC Latest Ref Rng & Units 09/15/2016 09/14/2016 09/13/2016  WBC 4.0 - 10.5 K/uL 15.6(H) 25.6(H) 12.9(H)  Hemoglobin 12.0 - 15.0 g/dL 8.6(L) 9.3(L) 8.4(L)  Hematocrit 36.0 - 46.0 % 28.8(L) 31.4(L) 26.7(L)  Platelets 150 - 400 K/uL 374 496(H) 350    ABG    Component Value Date/Time   PHART 7.155 (LL) 09/14/2016 0655   PCO2ART 78.7 (HH) 09/14/2016 0655   PO2ART 88.1 09/14/2016 0655   HCO3 26.6 09/14/2016 0655   TCO2 30 09/23/2016 0736    ACIDBASEDEF 1.3 09/14/2016 0655   O2SAT 95.8 09/14/2016 0655    CBG (last 3)   Recent Labs  09/15/16 0004 09/15/16 0425 09/15/16 0758  GLUCAP 223* 190* 218*    Imaging: No results found.   Studies: CT head 1/12 >> moderate chronic small vessel ischemic disease and cerebral atrophy CXR 1/13 >> The tracheostomy tube is in good position. A transcutaneous pacer overlies the left chest. A left subclavian line is stable. No pneumothorax. No overt edema. No other interval changes.  Lines/tubes: Trach >> Lt [place] CVL 1/12 >>   Cultures: Blood 1/12 >> NGTD Sputum 1/12 >> pending Urine 1/12 >> NGTD   Antibiotics: Vancomycin 1/12 >> Zosyn 1/12 >>  Events: 1/12 Transfer from [facility] 1/13 Off pressors 1/14 On pressors  Summary: 82 yo female s/p cardiac arrest 08/17/16 with anoxic encephalopathy and persistent vegetative state.  She was at [facility] in [place], [place] and then transferred to [facility] in [place], [place].  She developed PEA arrest 1/12 and transferred to [facility].  PMHx of CAD, DM, GI bleed, HTN   Assessment/plan:   Recurrent cardiac arrest. - recommend conservative management strategy - palliative care consulted, appreciate assistance  Anoxic encephalopathy with persistent vegetative state. - prn versed, fentanyl -  restarted on propofol gtt  Acute on chronic hypoxic hypercarbic respiratory failure. Failure to wean s/p tracheostomy with [place] armored trach. - full vent support - albuterol q2h prn wheezing  Sepsis with lactic acidosis and possible HCAP > WBC 22 >12 >23>15, cultures negative so far Oral Thrush  - continue Vanc/Zosyn - on levophed, goal SBP > 85 - clotrimazole troches not dissolving, start fluconazole  AKI: Creatinine increased from 1.09 to 1.87. -387 out yesterday.  -Follow BMET -On D5-1/2 NS at 75 cc/hr- consider change  Pressure ulcers. - present prior to this admission - wound care  DM type II. - SSI Q4H - CBG Q4H  DVT prophylaxis  - SCDs SUP - pepcid Nutrition - tube feeds Goals of care - full code.  Palliative care consulted  Martyn Malay, DO PGY-3 Internal Medicine Resident Pager # [phone] 09/15/2016 10:20 AM

## 2016-09-15 NOTE — Clinical Social Work Note (Signed)
CSW notified by Resident Alexa about this pt and the family not wanting to make decision on comfort care. The family doesn't want to live with making this decision. The medical team is comfortable making this decision but needs guidance on the process; Medical Futility Policy. CSW directed to policy on [facility] and also the option to consult with Ethics if unsure.  Ana Brown. Ana Brown,MSW, LCSWA Clinical Social Work Dept Weekend Social Worker [phone] 1:24 PM

## 2016-09-15 NOTE — Progress Notes (Addendum)
Dr. Rowe Pavy with Palliative Care met with the patient's daughter and the patient's grandson at the bedside. The other three adult children have elected Hoyle Sauer to be the medical decision maker. However, Hoyle Sauer does not want the burden or guilt associated with transitioning her mother to comfort care. Hoyle Sauer and her three other siblings do feel and know that their mother would not want to live like this and know that it is in her best interest to move towards comfort care. However, none of the children want that burden or responsibility on them to make that decision. Dr. Rowe Pavy and I discussed with family that it is the physicians' recommendations that to continue current medical treatment would be futile in meaningful recovery. She will most likely remain in her current vegetative state and ventilatory dependent. It is our recommendation to pursue comfort care and DNR. Family agrees with this decision, but wants [place] to make the decision.   Due to this, although there is no ethical disagreement or disagreement between family and physician, we will pursue the medical futility policy. We will follow the medical futility policy to its full; however, after discussing with social work, family, and physicians there is not a need to engage ethics as there is not an ethical disagreement.   In accordance with futility policy, we feel that continuing ventilator support and pressors and medical futile given patient's poor prognosis for regaining meaningful quality of life. We will not withdraw treatments that add to patients' comfort and dignity.   In keeping with [facility] Futility Policy, we have addressed the 6 steps:  1. We met with patient's family on multiple occasions to convey patient's prognosis and chance of recovery. We also explained futile care. Family agreed that continuing care would be futile; however, they did not want to make the decision to proceed to comfort care given the burden of guilt  that may be placed on them. They asked the medical team to make that decision.  2. We jointly agreed between physician and family to make patient DNR and in the next 24 hours, if no improvement, to proceed with extubation and comfort care.  3. Palliative care was consulted for additional recommendations who agreed with the medical team. As there were no disagreements among physicians regarding futility, the Ethics Committee was not consulted.  4. As there were no disagreements between health care providers and patient/proxy, the ethics committee was not consulted. 5. Not applicable- joint decision making used 6. Not applicable- joint decision making used and patient's family did not desire additional opinions or transfer to another facility  Given the above, the following physicians are signing a futility policy and evoking the right not to offer treatment or to withdraw treatment that has already been started: Dr. Loistine Chance, MD, Dr. Chesley Mires, MD and Dr. Martyn Malay, DO.   Martyn Malay, DO PGY-3 Internal Medicine Resident Pager # [phone] 09/15/2016 1:27 PM

## 2016-09-15 NOTE — Progress Notes (Addendum)
Daily Progress Note   Patient Name: Ana Brown       Date: 09/15/2016 DOB: [birth year]  Age: 82 y.o. MRN#: [id number] Attending Physician: Leslye Peerobert S Byrum, MD Primary Care Physician: Hillary BowROWLEY, MCKAY, MD Admit Date: 09/01/2016  Reason for Consultation/Follow-up: Establishing goals of care  82 yo female s/p cardiac arrest 08/17/16 with anoxic encephalopathy and persistent vegetative state.  She was at [facility] in [place], [place] and then transferred to [facility] in [place], [place].  She developed PEA arrest 1/12 and transferred to [facility].  PMHx of CAD, DM, GI bleed, HTN   Subjective:  remains unresponsive Does not withdraw to pain See below:  Length of Stay: 3  Current Medications: Scheduled Meds:  . atorvastatin  80 mg Per Tube Daily  . famotidine  20 mg Per Tube BID  . feeding supplement (PRO-STAT SUGAR FREE 64)  30 mL Per Tube TID  . feeding supplement (VITAL HIGH PROTEIN)  1,000 mL Per Tube Q24H  . fluconazole  200 mg Per Tube Daily  . heparin subcutaneous  5,000 Units Subcutaneous Q8H  . hydrocerin   Topical BID  . insulin aspart  0-20 Units Subcutaneous Q4H  . mouth rinse  15 mL Mouth Rinse 10 times per day  . piperacillin-tazobactam (ZOSYN)  IV  3.375 g Intravenous Q8H  . sodium chloride flush  10-40 mL Intracatheter Q12H  . vancomycin  750 mg Intravenous Q24H    Continuous Infusions: . sodium chloride 100 mL/hr at 09/15/16 1130  . norepinephrine (LEVOPHED) Adult infusion 6 mcg/min (09/15/16 1204)  . propofol (DIPRIVAN) infusion 5 mcg/kg/min (09/15/16 0623)    PRN Meds: acetaminophen, albuterol, fentaNYL (SUBLIMAZE) injection, midazolam, sodium chloride flush  Physical Exam         On vent Does not withdraw to pain Regular shallow breathing S1 S2 Edema   Vital  Signs: BP 138/80   Pulse 86   Temp 98.5 F (36.9 C) (Oral)   Resp (!) 26   Ht 5' (1.524 m)   Wt 78.7 kg (173 lb 8 oz)   SpO2 100%   BMI 33.88 kg/m  SpO2: SpO2: 100 % O2 Device: O2 Device: Ventilator O2 Flow Rate:    Intake/output summary:  Intake/Output Summary (Last 24 hours) at 09/15/16 1253 Last data filed at 09/15/16 0800  Gross per 24 hour  Intake          1061.57 ml  Output              360 ml  Net           701.57 ml   LBM: Last BM Date: 09/21/16 Baseline Weight: Weight: 71.2 kg (156 lb 15.5 oz) Most recent weight: Weight: 78.7 kg (173 lb 8 oz)       Palliative Assessment/Data:    Flowsheet Rows   Flowsheet Row Most Recent Value  Intake Tab  Referral Department  Critical care  Unit at Time of Referral  Intermediate Care Unit  Palliative Care Primary Diagnosis  Cardiac  Date Notified  09/13/16  Palliative Care Type  Return patient Palliative Care  Reason for referral  Clarify Goals of Care  Date of Admission  09/21/2016  Date first seen by Palliative Care  09/13/16  # of days Palliative referral response time  0 Day(s)  # of days IP prior to Palliative referral  1  Clinical Assessment  Palliative Performance Scale Score  10%  Pain Max last 24 hours  4  Pain Min Last 24 hours  3  Dyspnea Max Last 24 Hours  4  Dyspnea Min Last 24 hours  3  Nausea Max Last 24 Hours  4  Nausea Min Last 24 Hours  3  Anxiety Max Last 24 Hours  4  Psychosocial & Spiritual Assessment  Palliative Care Outcomes  Patient/Family meeting held?  Yes  Who was at the meeting?  daughter Bayard Beaver  Palliative Care Outcomes  Clarified goals of care      Patient Active Problem List   Diagnosis Date Noted  . Respiratory failure (HCC)   . Persistent vegetative state (HCC)   . Pressure injury of skin 09/13/2016  . Cardiac arrest [facility]) 2016/09/21    Palliative Care Assessment & Plan   Patient Profile:    Assessment:  82 yo female s/p cardiac arrest 08/17/16 with anoxic  encephalopathy and persistent vegetative state.  She was at [facility] in [place], [place] and then transferred to [facility] in [place], [place].  She developed PEA arrest 1/12 and transferred to [facility].  PMHx of CAD, DM, GI bleed, HTN    Active hospital issues: recurrent cardiac arrest, anoxic encephalopathy, persistent vegetative state, acute on chronic hypoxic hyper capneic resp failure, remains on full vent support, AKI, pressure ulcers.   Recommendations/Plan:   Call placed and briefly discussed with daughter Bayard Beaver at [phone]. She stated that she was at the patient's bedside. Subsequently, joint family meeting with Dr. Karlene Lineman critical care resident and the patient's daughter Bayard Beaver. Eber Jones informs [place] that the patient's 1 daughter and 2 sons, Ms. Uvaldo Rising other 3 siblings are not inclined to make any medical decisions and to have left it up to Ms. Lovell Sheehan. Ms. Lovell Sheehan states she feels uncomfortable to make any medical decisions. She does not want her siblings to place blame and guilt on her afterwards.   We discussed about the patient's current medical condition we discussed about the fact that it is medically appropriate, in my opinion, to consider DO NOT RESUSCITATE, additionally, and another 24 hours or so, consider withdrawal/liberation from machines, focus exclusively on comfort measures. To that extent, we discussed about no compressions, no shocks, compassionate liberation from the ventilator machine, discontinuation of PEG tube feedings, judicious use of opioids and benzodiazepines for comfort measures only. Daughter Eber Jones is not opposed to DNR    This is a unique  situation where there is no ethical disagreement, there simply is no decision maker even though there are 4 adult children. Palliative will continue to follow along and help guide appropriate decision-making. In my medical opinion, it is appropriate to establish DO NOT RESUSCITATE. The patient has anoxic  encephalopathy, is fully ventilator dependent with a tracheostomy and PEG tube placement since PEA arrest several weeks ago. She now does not even withdraw to pain, she has no meaningful quality of life.   Code Status:    Code Status Orders        Start     Ordered   09/15/16 1213  Do not attempt resuscitation (DNR)  Continuous    Question Answer Comment  In the event of cardiac or respiratory ARREST Do not call a "code blue"   In the event of cardiac or respiratory ARREST Do not perform Intubation, CPR, defibrillation or ACLS   In the event of cardiac or respiratory ARREST Use medication by any route, position, wound care, and other measures to relive pain and suffering. May use oxygen, suction and manual treatment of airway obstruction as needed for comfort.      09/15/16 1212    Code Status History    Date Active Date Inactive Code Status Order ID Comments User Context   09/02/2016  7:15 AM 09/15/2016 12:12 PM Full Code [id number]  Vilinda Blanks Minor, NP ED       Prognosis:   guarded.   Discharge Planning:  To Be Determined  Care plan was discussed with  Patient's daughter Bayard Beaver, Dr Karlene Lineman and RN  Thank you for allowing the Palliative Medicine Team to assist in the care of this patient.   Time In:  12 Time Out: 12.40 Total Time 40 Prolonged Time Billed  no       Greater than 50%  of this time was spent counseling and coordinating care related to the above assessment and plan.  Rosalin Hawking, MD [phone]  Please contact Palliative Medicine Team phone at [phone] for questions and concerns.

## 2016-09-16 DIAGNOSIS — Z515 Encounter for palliative care: Secondary | ICD-10-CM

## 2016-09-16 LAB — BASIC METABOLIC PANEL
ANION GAP: 14 (ref 5–15)
BUN: 80 mg/dL — ABNORMAL HIGH (ref 6–20)
CALCIUM: 8 mg/dL — AB (ref 8.9–10.3)
CO2: 23 mmol/L (ref 22–32)
Chloride: 102 mmol/L (ref 101–111)
Creatinine, Ser: 2.72 mg/dL — ABNORMAL HIGH (ref 0.44–1.00)
GFR, EST AFRICAN AMERICAN: 18 mL/min — AB (ref >60)
GFR, EST NON AFRICAN AMERICAN: 16 mL/min — AB (ref >60)
GLUCOSE: 108 mg/dL — AB (ref 65–99)
POTASSIUM: 3.4 mmol/L — AB (ref 3.5–5.1)
Sodium: 139 mmol/L (ref 135–145)

## 2016-09-16 LAB — GLUCOSE, CAPILLARY
GLUCOSE-CAPILLARY: 124 mg/dL — AB (ref 65–99)
GLUCOSE-CAPILLARY: 126 mg/dL — AB (ref 65–99)
Glucose-Capillary: 132 mg/dL — ABNORMAL HIGH (ref 65–99)

## 2016-09-16 LAB — CBC
HCT: 23.1 % — ABNORMAL LOW (ref 36.0–46.0)
Hemoglobin: 7.1 g/dL — ABNORMAL LOW (ref 12.0–15.0)
MCH: 25.8 pg — ABNORMAL LOW (ref 26.0–34.0)
MCHC: 30.7 g/dL (ref 30.0–36.0)
MCV: 84 fL (ref 78.0–100.0)
PLATELETS: 246 10*3/uL (ref 150–400)
RBC: 2.75 MIL/uL — AB (ref 3.87–5.11)
RDW: 19.9 % — AB (ref 11.5–15.5)
WBC: 9.3 10*3/uL (ref 4.0–10.5)

## 2016-09-16 MED ORDER — ACETAMINOPHEN 650 MG RE SUPP: 650.0000 mg | Freq: Four times a day (QID) | RECTAL | Status: DC | PRN

## 2016-09-16 MED ORDER — BIOTENE DRY MOUTH MT LIQD: 15.0000 mL | OROMUCOSAL | Status: DC | PRN

## 2016-09-16 MED ORDER — GLYCOPYRROLATE 0.2 MG/ML IJ SOLN: 0.2000 mg | INTRAMUSCULAR | Status: DC | PRN

## 2016-09-16 MED ORDER — MORPHINE BOLUS VIA INFUSION
5.0000 mg | INTRAVENOUS | Status: DC | PRN
Start: 2016-09-16 — End: 2016-09-16
  Administered 2016-09-16: 5 mg via INTRAVENOUS
  Filled 2016-09-16: qty 20

## 2016-09-16 MED ORDER — GLYCOPYRROLATE 1 MG PO TABS
1.0000 mg | ORAL_TABLET | ORAL | Status: DC | PRN
  Filled 2016-09-16: qty 1

## 2016-09-16 MED ORDER — HALOPERIDOL 0.5 MG PO TABS
0.5000 mg | ORAL_TABLET | ORAL | Status: DC | PRN
  Filled 2016-09-16: qty 1

## 2016-09-16 MED ORDER — HALOPERIDOL LACTATE 5 MG/ML IJ SOLN: 0.5000 mg | INTRAMUSCULAR | Status: DC | PRN

## 2016-09-16 MED ORDER — SODIUM CHLORIDE 0.9 % IV SOLN: INTRAVENOUS | Status: DC

## 2016-09-16 MED ORDER — ACETAMINOPHEN 325 MG PO TABS
650.0000 mg | ORAL_TABLET | Freq: Four times a day (QID) | ORAL | Status: DC | PRN
Start: 2016-09-16 — End: 2016-09-16

## 2016-09-16 MED ORDER — SODIUM CHLORIDE 0.9 % IV SOLN
10.0000 mg/h | INTRAVENOUS | Status: DC
  Administered 2016-09-16: 3 mg/h via INTRAVENOUS
  Filled 2016-09-16: qty 10
  Filled 2016-09-16: qty 5
  Filled 2016-09-16: qty 10

## 2016-09-16 MED ORDER — MORPHINE SULFATE (PF) 2 MG/ML IV SOLN: 1.0000 mg | INTRAVENOUS | Status: DC | PRN

## 2016-09-16 MED ORDER — POLYVINYL ALCOHOL 1.4 % OP SOLN: 1.0000 [drp] | Freq: Four times a day (QID) | OPHTHALMIC | Status: DC | PRN

## 2016-09-16 MED ORDER — SODIUM CHLORIDE 0.9 % IV SOLN
12.5000 mg | Freq: Four times a day (QID) | INTRAVENOUS | Status: DC | PRN
  Filled 2016-09-16: qty 0.5

## 2016-09-16 MED ORDER — ONDANSETRON HCL 4 MG/2ML IJ SOLN: 4.0000 mg | Freq: Four times a day (QID) | INTRAMUSCULAR | Status: DC | PRN

## 2016-09-16 MED ORDER — ONDANSETRON 4 MG PO TBDP: 4.0000 mg | ORAL_TABLET | Freq: Four times a day (QID) | ORAL | Status: DC | PRN

## 2016-09-16 MED ORDER — HALOPERIDOL LACTATE 2 MG/ML PO CONC
0.5000 mg | ORAL | Status: DC | PRN
  Filled 2016-09-16: qty 0.3

## 2016-09-17 LAB — CULTURE, BLOOD (ROUTINE X 2)
Culture: NO GROWTH
Culture: NO GROWTH

## 2016-09-24 ENCOUNTER — Telehealth: Payer: Self-pay

## 2016-09-24 NOTE — Telephone Encounter (Signed)
On 09/24/16 I received a death certificate from [employer] (original). The death certificate is for burial. The patient is a patient of Doctor Sood. The death certificate will be taken to Primary [facility] @ [facility] this pm for signature.  On 09/25/16 I received the death certificate back from Doctor Nicoma ParkSood. I got the death certificate ready and called the funeral home to let them know Dennis BastJoyce Johnson with [facility] is going to pickup the death certificate.

## 2016-10-02 NOTE — Progress Notes (Signed)
Pt asystole on monitor at 1549. 2 RNs Exie Parody(Beckham Capistran and Garnette CzechLexi Potter) auscultated for heart and lungs sounds x 2 mins. None heard. Will page MD and inform. No family at bedside. RN at bedside at time of passing. Spoke with Resident and she will call family and let them know. Will continue to provide support.

## 2016-10-02 NOTE — Progress Notes (Signed)
PCCM Progress Note  Subjective:  Decision made yesterday to proceed with DNR and enact medical futility act per request of family as they did not want the burden of making the decision of comfort care. No events overnight. Plans to move towards comfort care this afternoon.   Vitals: BP (!) 97/41 (BP Location: Left Arm)   Pulse 60   Temp 97.5 F (36.4 C) (Oral)   Resp 20   Ht 5' (1.524 m)   Wt 180 lb 12.4 oz (82 kg)   SpO2 100%   BMI 35.31 kg/m   Intake/outpt: I/O last 3 completed shifts: In: 3517.5 [I.V.:1977.5; Other:365; NG/GT:875; IV Piggyback:300] Out: 400 [Urine:300; Drains:100]  General: on vent, ill appearing Neuro: doesn't follow commands, does not withdraw to pain Eyes: pupils pinpoint with minimal reaction ENT: trach site with clear secretions, thrush improved Cardiac: regular rate, regular rhythm, no murmur Chest: expiratory wheezes, no rales or rhonchi Abd: soft, non distended Ext: 1+ edema   CMP Latest Ref Rng & Units 09/06/2016 09/15/2016 09/14/2016  Glucose 65 - 99 mg/dL 130(Q) 657(Q) 469(G)  BUN 6 - 20 mg/dL 29(B) 28(U) 13(K)  Creatinine 0.44 - 1.00 mg/dL 4.40(N) 0.27(O) 5.36(U)  Sodium 135 - 145 mmol/L 139 139 141  Potassium 3.5 - 5.1 mmol/L 3.4(L) 3.6 4.1  Chloride 101 - 111 mmol/L 102 101 104  CO2 22 - 32 mmol/L 23 25 29   Calcium 8.9 - 10.3 mg/dL 8.0(L) 8.8(L) 9.0  Total Protein 6.5 - 8.1 g/dL - - -  Total Bilirubin 0.3 - 1.2 mg/dL - - -  Alkaline Phos 38 - 126 U/L - - -  AST 15 - 41 U/L - - -  ALT 14 - 54 U/L - - -    CBC Latest Ref Rng & Units 09/29/2016 09/15/2016 09/14/2016  WBC 4.0 - 10.5 K/uL 9.3 15.6(H) 25.6(H)  Hemoglobin 12.0 - 15.0 g/dL 7.1(L) 8.6(L) 9.3(L)  Hematocrit 36.0 - 46.0 % 23.1(L) 28.8(L) 31.4(L)  Platelets 150 - 400 K/uL 246 374 496(H)    ABG    Component Value Date/Time   PHART 7.155 (LL) 09/14/2016 0655   PCO2ART 78.7 (HH) 09/14/2016 0655   PO2ART 88.1 09/14/2016 0655   HCO3 26.6 09/14/2016 0655   TCO2 30 09-15-16  0736   ACIDBASEDEF 1.3 09/14/2016 0655   O2SAT 95.8 09/14/2016 0655    CBG (last 3)   Recent Labs  09/15/16 2359 09/20/2016 0408 09/07/2016 0806  GLUCAP 100* 124* 132*    Imaging: No results found.   Studies: CT head 1/12 >> moderate chronic small vessel ischemic disease and cerebral atrophy CXR 1/13 >> The tracheostomy tube is in good position. A transcutaneous pacer overlies the left chest. A left subclavian line is stable. No pneumothorax. No overt edema. No other interval changes.  Lines/tubes: Trach >> Lt Parachute CVL 1/12 >>   Cultures: Blood 1/12 >> NGTD Sputum 1/12 >> pending Urine 1/12 >> NGTD   Antibiotics: Vancomycin 1/12 >> Zosyn 1/12 >>  Events: 1/12 Transfer from [facility] 1/13 Off pressors 1/14 On pressors 1/15 Off pressors  Summary: 82 yo female s/p cardiac arrest 08/17/16 with anoxic encephalopathy and persistent vegetative state.  She was at [facility] in [place], [place] and then transferred to [facility] in [place], [place].  She developed PEA arrest 1/12 and transferred to [facility].  PMHx of CAD, DM, GI bleed, HTN   Assessment/plan:   Recurrent cardiac arrest. - recommend conservative management strategy - palliative care consulted, appreciate assistance - move to comfort care this  afternoon   Anoxic encephalopathy with persistent vegetative state. - prn versed, fentanyl - on propofol gtt  Acute on chronic hypoxic hypercarbic respiratory failure. Failure to wean s/p tracheostomy with [place] armored trach. - full vent support - albuterol q2h prn wheezing  Sepsis with lactic acidosis and possible HCAP > WBC 22 >12 >23>15> 9.3, cultures negative so far Oral Thrush  - continue Vanc/Zosyn - now off levophed - on fluconazole  AKI: Creatinine increased from 1.09> 1.87>2.7 this am. -125 out yesterday. FENa consistent with pre-renal etiology. -Follow BMET -On NS at 125 cc/hr  Pressure ulcers. - present prior to this admission - wound care  DM type II. -  SSI Q4H - CBG Q4H  DVT prophylaxis - SCDs SUP - pepcid Nutrition - tube feeds Goals of care - full code.  Palliative care consulted  Karlene LinemanAlexa Maguadalupe Lata, DO PGY-3 Internal Medicine Resident Pager # [phone] Jan 20, 2017 9:08 AM

## 2016-10-02 NOTE — Discharge Summary (Addendum)
Ana Brown was a 82 y.o. female admitted on 09/07/2016 from [facility] with PEA cardiac arrest and HCAP.  She suffered cardiac arrest initially in November of 2017.  She was in persistent vegetative state.  She required tracheostomy and vent support.  She was treated with antibiotics.  She had progressive renal failure.  Palliative care was consulted.  Family agreed to DNR status, but deferred to medical team about withdrawing vent support.  Hospital compassionate care policy implemented.  She had vent support removed on June 28, 2017 and subsequently expired at 1529.  Final diagnoses: HCAP Septic shock present on admission Lactic acidosis Acute kidney injury Pressure ulcers present prior to this admission DM type II PEA cardiac arrest 2nd to hypoxic respiratory failure Acute on chronic diastolic CHF Tracheostomy status Persistent vegetative state Acute on chronic hypoxic/hypercapnic respiratory failure Rodrigo Ranhrush  Myrtha Tonkovich, MD [facility] 09/17/2016, 3:39 PM

## 2016-10-02 NOTE — Progress Notes (Signed)
Discussed with main HCPOA, Bayard BeaverCarolyn Jenkins, over the phone. Given no change in patient's status, we will proceed with comfort care this afternoon. Daughter voices understanding.  Karlene LinemanAlexa Burns, DO PGY-3 Internal Medicine Resident Pager # [phone] Jan 06, 2017 1:06 PM

## 2016-10-02 NOTE — Progress Notes (Signed)
Daily Progress Note   Patient Name: Ana Brown. Eulah Pont       Date: Sep 20, 2016 DOB: [birth year]  Age: 82 y.o. MRN#: [id number] Attending Physician: Leslye Peer, MD Primary Care Physician: Hillary Bow, MD Admit Date: 09/04/2016  Reason for Consultation/Follow-up: Establishing goals of care  82 yo female s/p cardiac arrest 08/17/16 with anoxic encephalopathy and persistent vegetative state.  She was at [facility] in [place], [place] and then transferred to [facility] in [place], [place].  She developed PEA arrest 1/12 and transferred to [facility].  PMHx of CAD, DM, GI bleed, HTN   Subjective:  remains unresponsive Does not withdraw to pain See below:  Length of Stay: 4  Current Medications: Scheduled Meds:  . atorvastatin  80 mg Per Tube Daily  . famotidine  20 mg Per Tube Daily  . feeding supplement (PRO-STAT SUGAR FREE 64)  30 mL Per Tube TID  . feeding supplement (VITAL HIGH PROTEIN)  1,000 mL Per Tube Q24H  . fluconazole  200 mg Per Tube Daily  . heparin subcutaneous  5,000 Units Subcutaneous Q8H  . hydrocerin   Topical BID  . insulin aspart  0-20 Units Subcutaneous Q4H  . mouth rinse  15 mL Mouth Rinse 10 times per day  . piperacillin-tazobactam (ZOSYN)  IV  3.375 g Intravenous Q12H  . sodium chloride flush  10-40 mL Intracatheter Q12H    Continuous Infusions: . sodium chloride 125 mL/hr at 09-20-2016 0900  . norepinephrine (LEVOPHED) Adult infusion Stopped (09/15/16 1640)  . propofol (DIPRIVAN) infusion 10.101 mcg/kg/min (20-Sep-2016 0738)    PRN Meds: acetaminophen, albuterol, fentaNYL (SUBLIMAZE) injection, midazolam, sodium chloride flush  Physical Exam         On vent Does not withdraw to pain Regular shallow breathing S1 S2 Edema   Vital Signs: BP (!) 97/41 (BP Location: Left  Arm)   Pulse 60   Temp 97.5 F (36.4 C) (Oral)   Resp 20   Ht 5' (1.524 m)   Wt 82 kg (180 lb 12.4 oz)   SpO2 100%   BMI 35.31 kg/m  SpO2: SpO2: 100 % O2 Device: O2 Device: Ventilator O2 Flow Rate:    Intake/output summary:   Intake/Output Summary (Last 24 hours) at 20-Sep-2016 0931 Last data filed at 09/20/2016 0800  Gross per 24 hour  Intake  0981.192664.13 ml  Output              125 ml  Net          2539.13 ml   LBM: Last BM Date: 09/11/2016 Baseline Weight: Weight: 71.2 kg (156 lb 15.5 oz) Most recent weight: Weight: 82 kg (180 lb 12.4 oz)       Palliative Assessment/Data:    Flowsheet Rows   Flowsheet Row Most Recent Value  Intake Tab  Referral Department  Critical care  Unit at Time of Referral  Intermediate Care Unit  Palliative Care Primary Diagnosis  Cardiac  Date Notified  09/13/16  Palliative Care Type  Return patient Palliative Care  Reason for referral  Clarify Goals of Care  Date of Admission  01-29-2017  Date first seen by Palliative Care  09/13/16  # of days Palliative referral response time  0 Day(s)  # of days IP prior to Palliative referral  1  Clinical Assessment  Palliative Performance Scale Score  10%  Pain Max last 24 hours  4  Pain Min Last 24 hours  3  Dyspnea Max Last 24 Hours  4  Dyspnea Min Last 24 hours  3  Nausea Max Last 24 Hours  4  Nausea Min Last 24 Hours  3  Anxiety Max Last 24 Hours  4  Psychosocial & Spiritual Assessment  Palliative Care Outcomes  Patient/Family meeting held?  Yes  Who was at the meeting?  daughter Bayard BeaverCarolyn Jenkins  Palliative Care Outcomes  Clarified goals of care      Patient Active Problem List   Diagnosis Date Noted  . Respiratory failure (HCC)   . Persistent vegetative state (HCC)   . Pressure injury of skin 09/13/2016  . Cardiac arrest [facility](H[facility]) 07-03-17    Palliative Care Assessment & Plan   Patient Profile:    Assessment:  82 yo female s/p cardiac arrest 08/17/16 with anoxic encephalopathy  and persistent vegetative state.  She was at [facility] in [place], [place] and then transferred to [facility] in [place], [place].  She developed PEA arrest 1/12 and [facility].  PMHx of CAD, DM, GI bleed, HTN    Active hospital issues: recurrent cardiac arrest, anoxic encephalopathy, persistent vegetative state, acute on chronic hypoxic hyper capneic resp failure, remains on full vent support, AKI, pressure ulcers.   Recommendations/Plan:  Chart reviewed, discussed case with Dr. Lawerance BachBurns, and I examined Ms. Eulah PontMurphy this AM.    Ms. Eulah PontMurphy has anoxic encephalopathy, is fully ventilator dependent with a tracheostomy and PEG tube placement since PEA arrest several weeks ago. She does not withdraw to pain, and family has agreed that her current condition would not be meaningful quality of life to her.  Family is not opposed to plan for comfort care, but also does not want the burden of decision making on her behalf.  Please see Dr. Lawerance BachBurns note regarding futility policy from 09/15/16.  I agree with plan for likely liberation from ventilator with focus on comfort this afternoon.  I believe this is appropriate next step in line with policy on medical futility.  This has been discussed at length with family, and they do not disagree with this plan.  However, family reports that they do not want "guilt" associated making decision to withdraw continued life support.  Code Status:    Code Status Orders        Start     Ordered   09/15/16 1213  Do not attempt resuscitation (DNR)  Continuous  Question Answer Comment  In the event of cardiac or respiratory ARREST Do not call a "code blue"   In the event of cardiac or respiratory ARREST Do not perform Intubation, CPR, defibrillation or ACLS   In the event of cardiac or respiratory ARREST Use medication by any route, position, wound care, and other measures to relive pain and suffering. May use oxygen, suction and manual treatment of airway obstruction as  needed for comfort.      09/15/16 1212    Code Status History    Date Active Date Inactive Code Status Order ID Comments User Context   09/20/2016  7:15 AM 09/15/2016 12:12 PM Full Code [id number]  Vilinda Blanks Minor, NP ED       Prognosis:   Likely plan for liberation from ventilator this afternoon.  I would anticipate her prognosis following this will be limited (hours to possibly days) at best.  Discharge Planning:  To Be Determined  Care plan was discussed with Dr. Lawerance Bach and bedside RN  Thank you for allowing the Palliative Medicine Team to assist in the care of this patient.   Time In: 0850 Time Out: 0910 Total Time 20 Prolonged Time Billed  no       Greater than 50%  of this time was spent counseling and coordinating care related to the above assessment and plan.  Romie Minus, MD [phone]  Please contact Palliative Medicine Team phone at [phone] for questions and concerns.

## 2016-10-02 NOTE — Progress Notes (Signed)
RT took patient off of vent due to end of life withdrawal orders. RN at bedside with RT during removal of vent.

## 2016-10-02 NOTE — Progress Notes (Signed)
Called patient's daughter Eber JonesCarolyn to inform her of patient's passing. She voiced understanding and appreciation of our help.   Karlene LinemanAlexa Zofia Peckinpaugh, DO PGY-3 Internal Medicine Resident Pager # [phone] 2017/07/29 3:56 PM

## 2016-10-02 DEATH — deceased

## 2018-01-16 IMAGING — CR DG CHEST 1V PORT
1 series · 1 of 1 positions shown · non-contrast
Comparison: None.

CLINICAL DATA: Post cardiac arrest

EXAM:
PORTABLE CHEST 1 VIEW

[AP]
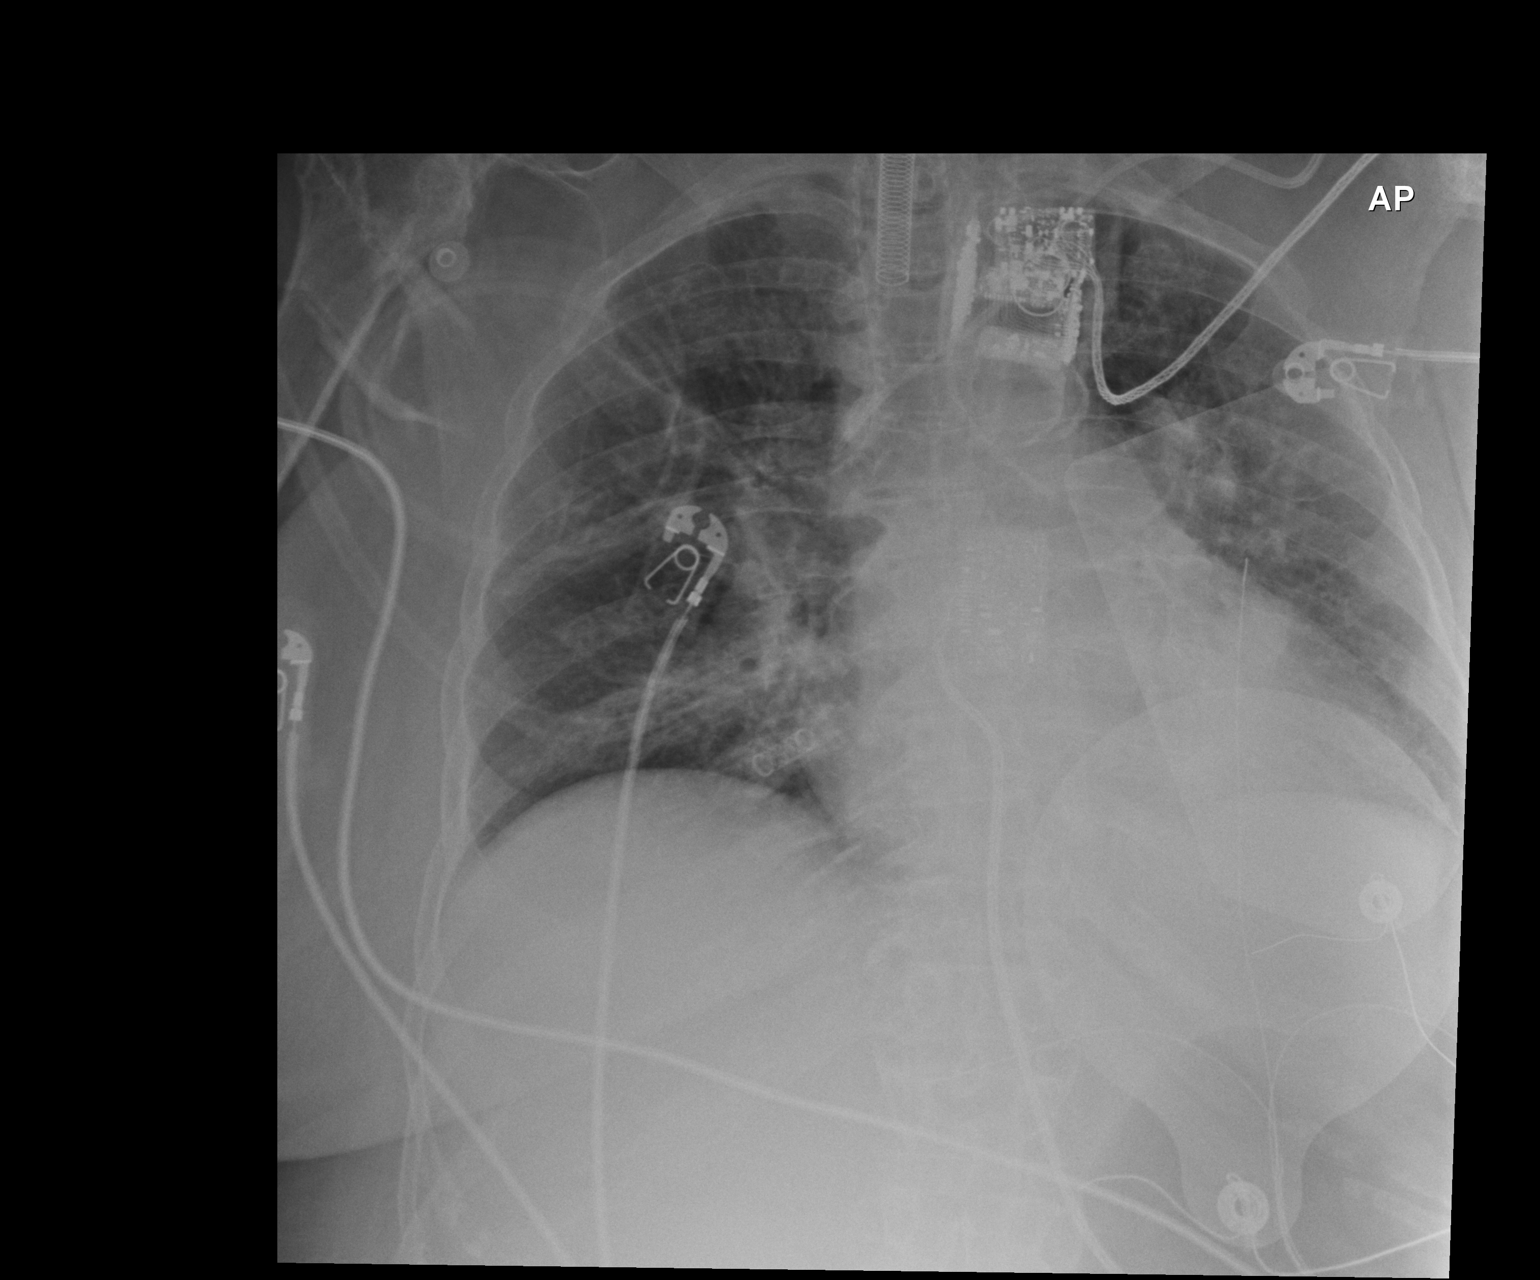

[1 of 1 positions shown; findings below may reference images not displayed]

FINDINGS: Ventilatory device overlies the trachea at the level of the
clavicles. There is mild cardiomegaly with aortic atherosclerosis.
Bilateral mild parahilar opacities without focal consolidation or
overt pulmonary edema. Left subclavian approach central venous
catheter tip is at the junction of the brachiocephalic vein and
superior vena cava.
IMPRESSION: 1. Aortic atherosclerosis and mild cardiomegaly without overt
pulmonary edema or focal consolidation.
2. Left subclavian approach central venous catheter tip at the
junction of the brachiocephalic vein and superior vena cava.

## 2018-01-17 IMAGING — CR DG CHEST 1V PORT
1 series · 1 of 1 positions shown · non-contrast
Comparison: September 12, 2016

CLINICAL DATA: Respiratory failure

EXAM:
PORTABLE CHEST 1 VIEW

[AP]
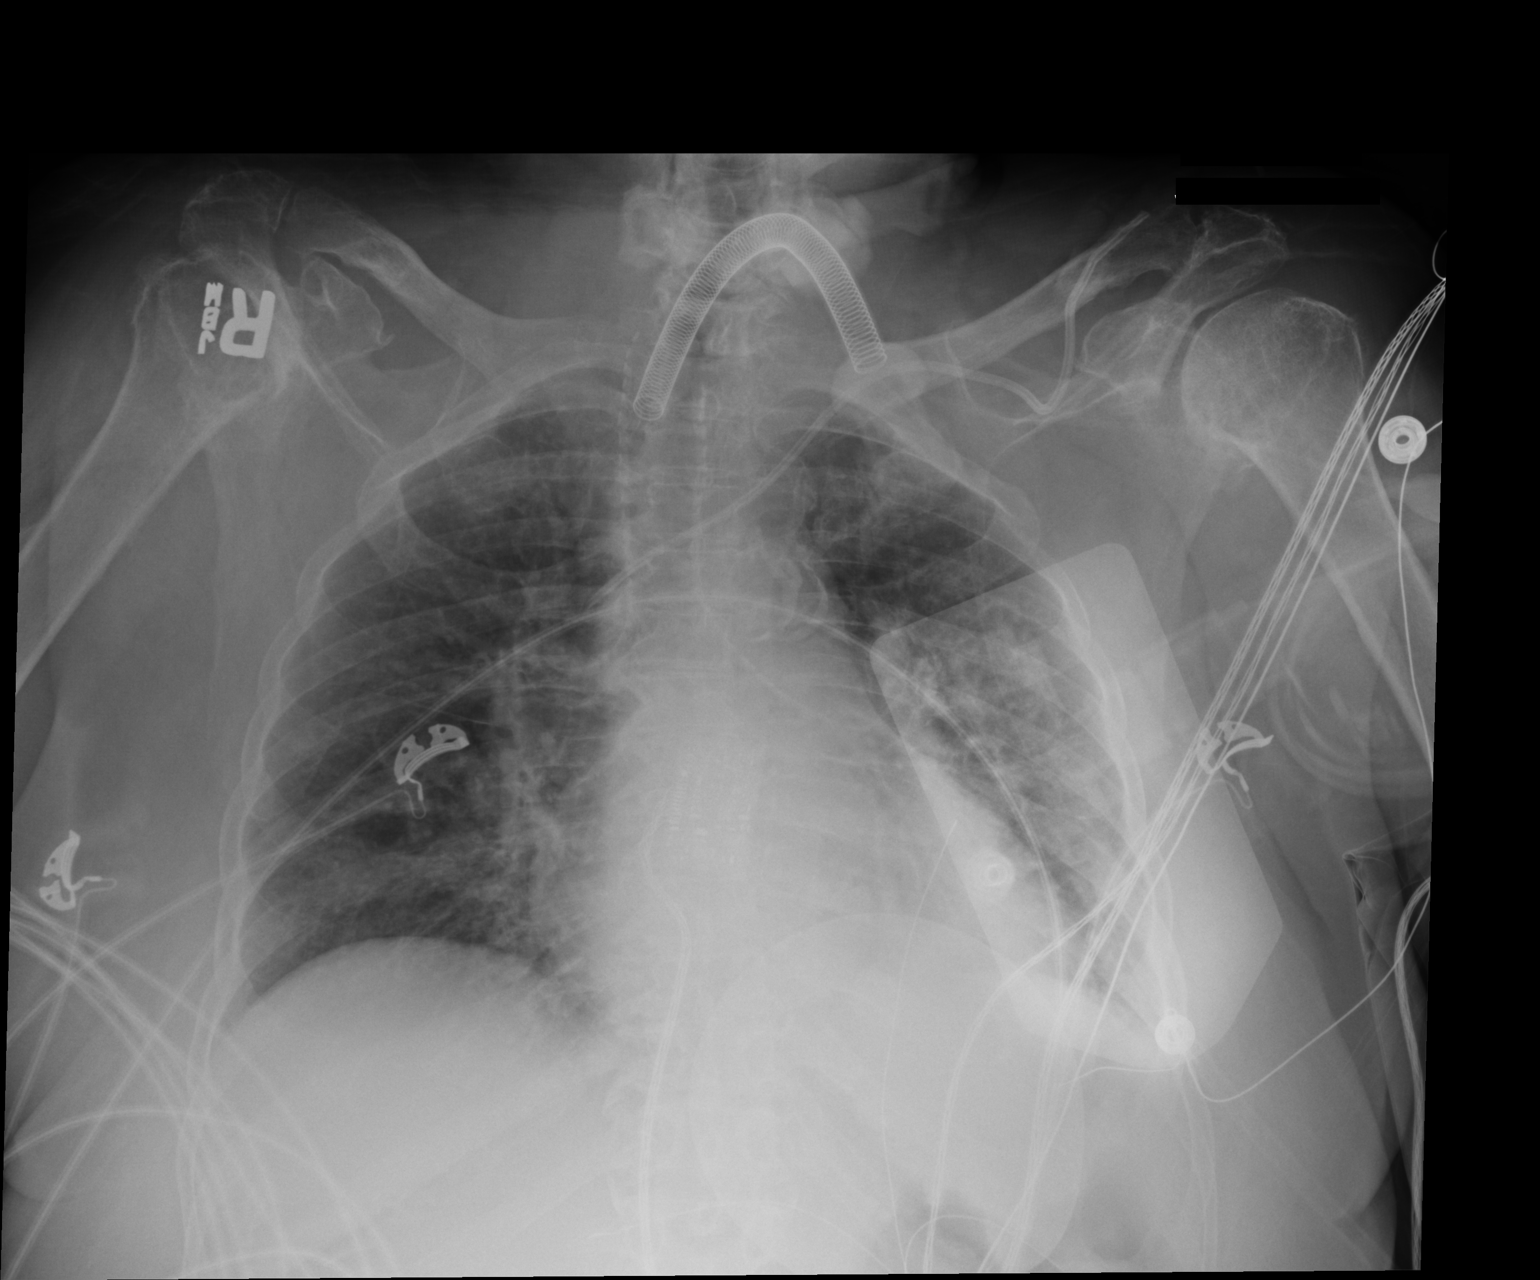

[1 of 1 positions shown; findings below may reference images not displayed]

FINDINGS: The tracheostomy tube is in good position. A transcutaneous pacer
overlies the left chest. A left subclavian line is stable. No
pneumothorax. No overt edema. No other interval changes.
IMPRESSION: No interval change.
